# Patient Record
Sex: Female | Born: 1970 | Race: White | Hispanic: No | Smoking: Never smoker
Health system: Southern US, Community
[De-identification: ages and names within clinical notes are randomized; demographics above are authoritative.]

## PROBLEM LIST (undated history)

## (undated) DIAGNOSIS — C801 Malignant (primary) neoplasm, unspecified: Secondary | ICD-10-CM

## (undated) DIAGNOSIS — R0600 Dyspnea, unspecified: Secondary | ICD-10-CM

## (undated) DIAGNOSIS — S7290XA Unspecified fracture of unspecified femur, initial encounter for closed fracture: Secondary | ICD-10-CM

## (undated) DIAGNOSIS — F32A Depression, unspecified: Secondary | ICD-10-CM

## (undated) DIAGNOSIS — J45909 Unspecified asthma, uncomplicated: Secondary | ICD-10-CM

## (undated) DIAGNOSIS — M1990 Unspecified osteoarthritis, unspecified site: Secondary | ICD-10-CM

## (undated) DIAGNOSIS — I10 Essential (primary) hypertension: Secondary | ICD-10-CM

## (undated) DIAGNOSIS — G43909 Migraine, unspecified, not intractable, without status migrainosus: Secondary | ICD-10-CM

## (undated) DIAGNOSIS — I639 Cerebral infarction, unspecified: Secondary | ICD-10-CM

## (undated) DIAGNOSIS — F419 Anxiety disorder, unspecified: Secondary | ICD-10-CM

## (undated) DIAGNOSIS — E785 Hyperlipidemia, unspecified: Secondary | ICD-10-CM

## (undated) DIAGNOSIS — T4145XA Adverse effect of unspecified anesthetic, initial encounter: Secondary | ICD-10-CM

## (undated) DIAGNOSIS — G894 Chronic pain syndrome: Secondary | ICD-10-CM

## (undated) DIAGNOSIS — Z87442 Personal history of urinary calculi: Secondary | ICD-10-CM

## (undated) DIAGNOSIS — K219 Gastro-esophageal reflux disease without esophagitis: Secondary | ICD-10-CM

## (undated) DIAGNOSIS — T8859XA Other complications of anesthesia, initial encounter: Secondary | ICD-10-CM

## (undated) DIAGNOSIS — R002 Palpitations: Secondary | ICD-10-CM

## (undated) DIAGNOSIS — F329 Major depressive disorder, single episode, unspecified: Secondary | ICD-10-CM

## (undated) HISTORY — PX: LITHOTRIPSY: SUR834

## (undated) HISTORY — PX: EYE SURGERY: SHX253

---

## 2008-11-04 ENCOUNTER — Ambulatory Visit (HOSPITAL_COMMUNITY): Admission: RE | Admit: 2008-11-04 | Discharge: 2008-11-04 | Payer: Self-pay | Admitting: Urology

## 2008-11-08 ENCOUNTER — Ambulatory Visit (HOSPITAL_COMMUNITY): Admission: RE | Admit: 2008-11-08 | Discharge: 2008-11-08 | Payer: Self-pay | Admitting: Urology

## 2008-11-19 HISTORY — PX: SHOULDER SURGERY: SHX246

## 2009-09-06 ENCOUNTER — Ambulatory Visit (HOSPITAL_BASED_OUTPATIENT_CLINIC_OR_DEPARTMENT_OTHER): Admission: RE | Admit: 2009-09-06 | Discharge: 2009-09-06 | Payer: Self-pay | Admitting: Orthopaedic Surgery

## 2011-02-22 LAB — BASIC METABOLIC PANEL
BUN: 11 mg/dL (ref 6–23)
CO2: 30 mEq/L (ref 19–32)
Chloride: 105 mEq/L (ref 96–112)
Glucose, Bld: 113 mg/dL — ABNORMAL HIGH (ref 70–99)
Potassium: 4.5 mEq/L (ref 3.5–5.1)

## 2011-08-24 LAB — CBC
HCT: 42.9 % (ref 36.0–46.0)
Hemoglobin: 14.8 g/dL (ref 12.0–15.0)
MCHC: 34.4 g/dL (ref 30.0–36.0)
MCV: 89.8 fL (ref 78.0–100.0)
RDW: 12.4 % (ref 11.5–15.5)

## 2012-06-12 DIAGNOSIS — M79673 Pain in unspecified foot: Secondary | ICD-10-CM | POA: Insufficient documentation

## 2012-06-12 HISTORY — DX: Pain in unspecified foot: M79.673

## 2012-12-15 DIAGNOSIS — G894 Chronic pain syndrome: Secondary | ICD-10-CM | POA: Insufficient documentation

## 2012-12-15 HISTORY — DX: Chronic pain syndrome: G89.4

## 2013-03-13 DIAGNOSIS — M5416 Radiculopathy, lumbar region: Secondary | ICD-10-CM

## 2013-03-13 HISTORY — DX: Radiculopathy, lumbar region: M54.16

## 2015-08-08 DIAGNOSIS — J370 Chronic laryngitis: Secondary | ICD-10-CM

## 2015-08-08 DIAGNOSIS — J309 Allergic rhinitis, unspecified: Secondary | ICD-10-CM

## 2015-08-08 HISTORY — DX: Chronic laryngitis: J37.0

## 2015-08-08 HISTORY — DX: Allergic rhinitis, unspecified: J30.9

## 2015-08-25 ENCOUNTER — Ambulatory Visit: Payer: Self-pay | Admitting: Allergy and Immunology

## 2015-09-19 ENCOUNTER — Ambulatory Visit: Payer: Self-pay | Admitting: Allergy and Immunology

## 2016-09-26 ENCOUNTER — Other Ambulatory Visit: Payer: Self-pay

## 2017-03-14 DIAGNOSIS — F331 Major depressive disorder, recurrent, moderate: Secondary | ICD-10-CM | POA: Insufficient documentation

## 2017-03-14 DIAGNOSIS — F411 Generalized anxiety disorder: Secondary | ICD-10-CM

## 2017-03-14 HISTORY — DX: Major depressive disorder, recurrent, moderate: F33.1

## 2017-03-14 HISTORY — DX: Generalized anxiety disorder: F41.1

## 2018-01-13 ENCOUNTER — Other Ambulatory Visit: Payer: Self-pay | Admitting: Anesthesiology

## 2018-01-27 ENCOUNTER — Encounter (HOSPITAL_COMMUNITY)
Admission: RE | Admit: 2018-01-27 | Discharge: 2018-01-27 | Disposition: A | Discharge status: Home / Self Care | Payer: Medicaid Other | Source: Ambulatory Visit | Attending: Anesthesiology | Admitting: Anesthesiology

## 2018-01-27 ENCOUNTER — Other Ambulatory Visit: Payer: Self-pay

## 2018-01-27 ENCOUNTER — Encounter (HOSPITAL_COMMUNITY): Payer: Self-pay

## 2018-01-27 DIAGNOSIS — F4542 Pain disorder with related psychological factors: Secondary | ICD-10-CM | POA: Diagnosis not present

## 2018-01-27 DIAGNOSIS — I10 Essential (primary) hypertension: Secondary | ICD-10-CM | POA: Insufficient documentation

## 2018-01-27 DIAGNOSIS — F411 Generalized anxiety disorder: Secondary | ICD-10-CM | POA: Insufficient documentation

## 2018-01-27 DIAGNOSIS — Z01818 Encounter for other preprocedural examination: Secondary | ICD-10-CM | POA: Insufficient documentation

## 2018-01-27 DIAGNOSIS — G90522 Complex regional pain syndrome I of left lower limb: Secondary | ICD-10-CM | POA: Insufficient documentation

## 2018-01-27 DIAGNOSIS — F609 Personality disorder, unspecified: Secondary | ICD-10-CM | POA: Diagnosis not present

## 2018-01-27 DIAGNOSIS — R9431 Abnormal electrocardiogram [ECG] [EKG]: Secondary | ICD-10-CM | POA: Insufficient documentation

## 2018-01-27 DIAGNOSIS — F3342 Major depressive disorder, recurrent, in full remission: Secondary | ICD-10-CM | POA: Diagnosis not present

## 2018-01-27 HISTORY — DX: Cerebral infarction, unspecified: I63.9

## 2018-01-27 HISTORY — DX: Major depressive disorder, single episode, unspecified: F32.9

## 2018-01-27 HISTORY — DX: Chronic pain syndrome: G89.4

## 2018-01-27 HISTORY — DX: Essential (primary) hypertension: I10

## 2018-01-27 HISTORY — DX: Anxiety disorder, unspecified: F41.9

## 2018-01-27 HISTORY — DX: Hyperlipidemia, unspecified: E78.5

## 2018-01-27 HISTORY — DX: Migraine, unspecified, not intractable, without status migrainosus: G43.909

## 2018-01-27 HISTORY — DX: Depression, unspecified: F32.A

## 2018-01-27 HISTORY — DX: Adverse effect of unspecified anesthetic, initial encounter: T41.45XA

## 2018-01-27 HISTORY — DX: Dyspnea, unspecified: R06.00

## 2018-01-27 HISTORY — DX: Malignant (primary) neoplasm, unspecified: C80.1

## 2018-01-27 HISTORY — DX: Unspecified osteoarthritis, unspecified site: M19.90

## 2018-01-27 HISTORY — DX: Palpitations: R00.2

## 2018-01-27 HISTORY — DX: Unspecified asthma, uncomplicated: J45.909

## 2018-01-27 HISTORY — DX: Personal history of urinary calculi: Z87.442

## 2018-01-27 HISTORY — DX: Unspecified fracture of unspecified femur, initial encounter for closed fracture: S72.90XA

## 2018-01-27 HISTORY — DX: Other complications of anesthesia, initial encounter: T88.59XA

## 2018-01-27 HISTORY — DX: Gastro-esophageal reflux disease without esophagitis: K21.9

## 2018-01-27 LAB — BASIC METABOLIC PANEL
Anion gap: 10 (ref 5–15)
BUN: 7 mg/dL (ref 6–20)
CHLORIDE: 106 mmol/L (ref 101–111)
CO2: 22 mmol/L (ref 22–32)
CREATININE: 0.72 mg/dL (ref 0.44–1.00)
Calcium: 9.5 mg/dL (ref 8.9–10.3)
GFR calc Af Amer: >60 mL/min (ref >60)
GFR calc non Af Amer: >60 mL/min (ref >60)
GLUCOSE: 109 mg/dL — AB (ref 65–99)
POTASSIUM: 3.8 mmol/L (ref 3.5–5.1)
SODIUM: 138 mmol/L (ref 135–145)

## 2018-01-27 LAB — APTT: aPTT: 30 seconds (ref 24–36)

## 2018-01-27 LAB — CBC
HEMATOCRIT: 40.5 % (ref 36.0–46.0)
Hemoglobin: 13.7 g/dL (ref 12.0–15.0)
MCH: 29.3 pg (ref 26.0–34.0)
MCHC: 33.8 g/dL (ref 30.0–36.0)
MCV: 86.5 fL (ref 78.0–100.0)
PLATELETS: 223 10*3/uL (ref 150–400)
RBC: 4.68 MIL/uL (ref 3.87–5.11)
RDW: 12.9 % (ref 11.5–15.5)
WBC: 10 10*3/uL (ref 4.0–10.5)

## 2018-01-27 LAB — PROTIME-INR
INR: 1.02
Prothrombin Time: 13.3 seconds (ref 11.4–15.2)

## 2018-01-27 LAB — SURGICAL PCR SCREEN
MRSA, PCR: NEGATIVE
Staphylococcus aureus: NEGATIVE

## 2018-01-27 NOTE — Pre-Procedure Instructions (Signed)
Sandy Garcia  01/27/2018      [facility] - [place], [place] - [place] [address] Phone: [phone] Fax: [phone]    Your procedure is scheduled on January 31, 2018.  Report to [facility] Admitting at 07:30 A.M.  Call this number if you have problems the morning of surgery:  [phone]   Remember:  Do not eat food or drink liquids after midnight.  Continue all other medications as directed by your physician except for following these medication instructions before surgery.   Take these medicines the morning of surgery with A SIP OF WATER : Albuterol inhaler if needed--bring with you Bupropion (Wellbutrin XL) Cetirizine (Zyrtec) Gabapentin Enacarbil (Horizant) Omeprazole (Prilosec) Ranitidine (Zantac) Pain pill Hydrocodone-acetaminophen if needed  7 days prior to surgery STOP taking any Aspirin (unless otherwise instructed by your surgeon), Aleve, Naproxen, Ibuprofen, Motrin, Advil, Goody's, BC's, all herbal medications, fish oil, and all vitamins.   Do not wear jewelry, make-up or nail polish.  Do not wear lotions, powders, or perfumes, or deodorant.  Do not shave 48 hours prior to surgery.    Do not bring valuables to the hospital.  [facility] is not responsible for any belongings or valuables.  Contacts, dentures or bridgework may not be worn into surgery.  Leave your suitcase in the car.  After surgery it may be brought to your room.  For patients admitted to the hospital, discharge time will be determined by your treatment team.  Patients discharged the day of surgery will not be allowed to drive home.   Special instructions:   - Preparing For Surgery  Before surgery, you can play an important role. Because skin is not sterile, your skin needs to be as free of germs as possible. You can reduce the number of germs on your skin by washing with CHG (chlorahexidine gluconate) Soap before  surgery.  CHG is an antiseptic cleaner which kills germs and bonds with the skin to continue killing germs even after washing.  Please do not use if you have an allergy to CHG or antibacterial soaps. If your skin becomes reddened/irritated stop using the CHG.  Do not shave (including legs and underarms) for at least 48 hours prior to first CHG shower. It is OK to shave your face.  Please follow these instructions carefully.   1. Shower the NIGHT BEFORE SURGERY and the MORNING OF SURGERY with CHG.   2. If you chose to wash your hair, wash your hair first as usual with your normal shampoo.  3. After you shampoo, rinse your hair and body thoroughly to remove the shampoo.  4. Use CHG as you would any other liquid soap. You can apply CHG directly to the skin and wash gently with a scrungie or a clean washcloth.   5. Apply the CHG Soap to your body ONLY FROM THE NECK DOWN.  Do not use on open wounds or open sores. Avoid contact with your eyes, ears, mouth and genitals (private parts). Wash Face and genitals (private parts)  with your normal soap.  6. Wash thoroughly, paying special attention to the area where your surgery will be performed.  7. Thoroughly rinse your body with warm water from the neck down.  8. DO NOT shower/wash with your normal soap after using and rinsing off the CHG Soap.  9. Pat yourself dry with a CLEAN TOWEL.  10. Wear CLEAN PAJAMAS to bed the  night before surgery, wear comfortable clothes the morning of surgery  11. Place CLEAN SHEETS on your bed the night of your first shower and DO NOT SLEEP WITH PETS.    Day of Surgery: Do not apply any deodorants/lotions. Please wear clean clothes to the hospital/surgery center.      Please read over the following fact sheets that you were given.

## 2018-01-27 NOTE — Progress Notes (Signed)
PCP: Dr. Sarina Ser Blackwood Cardiologist: denies  EKG: Today CXR: denies ECHO: denies Stress Test: denies Cardiac Cath: denies  Patient denies shortness of breath, fever, cough, and chest pain at PAT appointment.  Patient verbalized understanding of instructions provided today at the PAT appointment.  Patient asked to review instructions at home and day of surgery.   Dr. Donell Sievert office called about Ancef allergy, left VM.

## 2018-01-28 ENCOUNTER — Encounter (HOSPITAL_COMMUNITY): Payer: Self-pay | Admitting: Vascular Surgery
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
## 2018-01-31 ENCOUNTER — Encounter (HOSPITAL_COMMUNITY): Admission: RE | Payer: Self-pay | Source: Ambulatory Visit

## 2018-01-31 ENCOUNTER — Ambulatory Visit (HOSPITAL_COMMUNITY): Admission: RE | Admit: 2018-01-31 | Payer: Medicaid Other | Source: Ambulatory Visit | Admitting: Anesthesiology

## 2018-01-31 SURGERY — INSERTION, SPINAL CORD STIMULATOR, LUMBAR
Anesthesia: Monitor Anesthesia Care

## 2018-02-03 ENCOUNTER — Other Ambulatory Visit: Payer: Self-pay | Admitting: Anesthesiology
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
## 2018-02-27 ENCOUNTER — Encounter (HOSPITAL_COMMUNITY): Payer: Self-pay | Admitting: *Deleted

## 2018-02-27 ENCOUNTER — Other Ambulatory Visit: Payer: Self-pay

## 2018-02-27 MED ORDER — VANCOMYCIN HCL 10 G IV SOLR
1500.0000 mg | INTRAVENOUS | Status: AC
  Administered 2018-02-28: 1500 mg via INTRAVENOUS
  Filled 2018-02-27: qty 1500

## 2018-02-27 NOTE — Progress Notes (Signed)
   02/27/18 1847  OBSTRUCTIVE SLEEP APNEA  Have you ever been diagnosed with sleep apnea through a sleep study? No  Do you snore loudly (loud enough to be heard through closed doors)?  1  Do you often feel tired, fatigued, or sleepy during the daytime (such as falling asleep during driving or talking to someone)? 0  Has anyone observed you stop breathing during your sleep? 1  Do you have, or are you being treated for high blood pressure? 1  BMI more than 35 kg/m2? 1  Age > 50 (1-yes) 0  Neck circumference greater than:Female 16 inches or larger, Female 17inches or larger? 1 (16.5)  Female Gender (Yes=1) 0  Obstructive Sleep Apnea Score 5  Score 5 or greater  Results sent to PCP

## 2018-02-28 ENCOUNTER — Observation Stay (HOSPITAL_COMMUNITY)
Admission: RE | Admit: 2018-02-28 | Discharge: 2018-03-01 | Disposition: A | Discharge status: Home / Self Care | Payer: Medicaid Other | Source: Ambulatory Visit | Attending: Anesthesiology | Admitting: Anesthesiology

## 2018-02-28 ENCOUNTER — Encounter (HOSPITAL_COMMUNITY)
Admission: RE | Disposition: A | Discharge status: Home / Self Care | Payer: Self-pay | Source: Ambulatory Visit | Attending: Anesthesiology

## 2018-02-28 ENCOUNTER — Ambulatory Visit (HOSPITAL_COMMUNITY): Payer: Medicaid Other | Admitting: Certified Registered"

## 2018-02-28 ENCOUNTER — Inpatient Hospital Stay (HOSPITAL_COMMUNITY): Payer: Medicaid Other

## 2018-02-28 ENCOUNTER — Encounter (HOSPITAL_COMMUNITY): Payer: Self-pay | Admitting: Surgery

## 2018-02-28 ENCOUNTER — Ambulatory Visit (HOSPITAL_COMMUNITY): Payer: Medicaid Other

## 2018-02-28 DIAGNOSIS — Z6841 Body Mass Index (BMI) 40.0 and over, adult: Secondary | ICD-10-CM | POA: Diagnosis not present

## 2018-02-28 DIAGNOSIS — Z9689 Presence of other specified functional implants: Secondary | ICD-10-CM

## 2018-02-28 DIAGNOSIS — I10 Essential (primary) hypertension: Secondary | ICD-10-CM | POA: Diagnosis not present

## 2018-02-28 DIAGNOSIS — G894 Chronic pain syndrome: Principal | ICD-10-CM | POA: Insufficient documentation

## 2018-02-28 DIAGNOSIS — Z8673 Personal history of transient ischemic attack (TIA), and cerebral infarction without residual deficits: Secondary | ICD-10-CM | POA: Insufficient documentation

## 2018-02-28 DIAGNOSIS — Z881 Allergy status to other antibiotic agents status: Secondary | ICD-10-CM | POA: Insufficient documentation

## 2018-02-28 DIAGNOSIS — J45909 Unspecified asthma, uncomplicated: Secondary | ICD-10-CM | POA: Diagnosis not present

## 2018-02-28 DIAGNOSIS — G8929 Other chronic pain: Secondary | ICD-10-CM | POA: Diagnosis not present

## 2018-02-28 DIAGNOSIS — E785 Hyperlipidemia, unspecified: Secondary | ICD-10-CM | POA: Insufficient documentation

## 2018-02-28 DIAGNOSIS — Z885 Allergy status to narcotic agent status: Secondary | ICD-10-CM | POA: Insufficient documentation

## 2018-02-28 DIAGNOSIS — Z79899 Other long term (current) drug therapy: Secondary | ICD-10-CM | POA: Insufficient documentation

## 2018-02-28 DIAGNOSIS — Z882 Allergy status to sulfonamides status: Secondary | ICD-10-CM | POA: Insufficient documentation

## 2018-02-28 DIAGNOSIS — G90522 Complex regional pain syndrome I of left lower limb: Secondary | ICD-10-CM | POA: Diagnosis not present

## 2018-02-28 DIAGNOSIS — Z9889 Other specified postprocedural states: Secondary | ICD-10-CM | POA: Diagnosis present

## 2018-02-28 DIAGNOSIS — K219 Gastro-esophageal reflux disease without esophagitis: Secondary | ICD-10-CM | POA: Diagnosis not present

## 2018-02-28 DIAGNOSIS — Z91040 Latex allergy status: Secondary | ICD-10-CM | POA: Diagnosis not present

## 2018-02-28 DIAGNOSIS — Z4889 Encounter for other specified surgical aftercare: Secondary | ICD-10-CM

## 2018-02-28 DIAGNOSIS — R0902 Hypoxemia: Secondary | ICD-10-CM

## 2018-02-28 DIAGNOSIS — Z419 Encounter for procedure for purposes other than remedying health state, unspecified: Secondary | ICD-10-CM

## 2018-02-28 HISTORY — DX: Presence of other specified functional implants: Z96.89

## 2018-02-28 HISTORY — DX: Hypoxemia: R09.02

## 2018-02-28 HISTORY — PX: SPINAL CORD STIMULATOR INSERTION: SHX5378

## 2018-02-28 LAB — BASIC METABOLIC PANEL
Anion gap: 11 (ref 5–15)
BUN: 8 mg/dL (ref 6–20)
CO2: 22 mmol/L (ref 22–32)
CREATININE: 0.73 mg/dL (ref 0.44–1.00)
Calcium: 8.9 mg/dL (ref 8.9–10.3)
Chloride: 105 mmol/L (ref 101–111)
GFR calc non Af Amer: >60 mL/min (ref >60)
Glucose, Bld: 108 mg/dL — ABNORMAL HIGH (ref 65–99)
Potassium: 3.4 mmol/L — ABNORMAL LOW (ref 3.5–5.1)
SODIUM: 138 mmol/L (ref 135–145)

## 2018-02-28 LAB — CBC
HCT: 37.8 % (ref 36.0–46.0)
Hemoglobin: 12.8 g/dL (ref 12.0–15.0)
MCH: 29.5 pg (ref 26.0–34.0)
MCHC: 33.9 g/dL (ref 30.0–36.0)
MCV: 87.1 fL (ref 78.0–100.0)
PLATELETS: 221 10*3/uL (ref 150–400)
RBC: 4.34 MIL/uL (ref 3.87–5.11)
RDW: 12.9 % (ref 11.5–15.5)
WBC: 8.6 10*3/uL (ref 4.0–10.5)

## 2018-02-28 LAB — POCT PREGNANCY, URINE: Preg Test, Ur: NEGATIVE

## 2018-02-28 SURGERY — INSERTION, SPINAL CORD STIMULATOR, LUMBAR
Anesthesia: General | Site: Spine Lumbar

## 2018-02-28 MED ORDER — ALUM & MAG HYDROXIDE-SIMETH 200-200-20 MG/5ML PO SUSP
30.0000 mL | ORAL | Status: DC | PRN
  Administered 2018-02-28: 30 mL via ORAL
  Filled 2018-02-28: qty 30

## 2018-02-28 MED ORDER — ALBUTEROL SULFATE (2.5 MG/3ML) 0.083% IN NEBU
2.5000 mg | INHALATION_SOLUTION | Freq: Four times a day (QID) | RESPIRATORY_TRACT | Status: DC | PRN
Start: 2018-02-28 — End: 2018-03-01
  Filled 2018-02-28: qty 3

## 2018-02-28 MED ORDER — FENTANYL CITRATE (PF) 100 MCG/2ML IJ SOLN
INTRAMUSCULAR | Status: DC | PRN
  Administered 2018-02-28: 50 ug via INTRAVENOUS
  Administered 2018-02-28: 200 ug via INTRAVENOUS
  Administered 2018-02-28 (×3): 50 ug via INTRAVENOUS

## 2018-02-28 MED ORDER — LIDOCAINE 2% (20 MG/ML) 5 ML SYRINGE
INTRAMUSCULAR | Status: DC | PRN
  Administered 2018-02-28: 100 mg via INTRAVENOUS

## 2018-02-28 MED ORDER — HYDROMORPHONE HCL 1 MG/ML IJ SOLN
INTRAMUSCULAR | Status: AC
  Filled 2018-02-28: qty 1

## 2018-02-28 MED ORDER — DEXAMETHASONE SODIUM PHOSPHATE 10 MG/ML IJ SOLN
INTRAMUSCULAR | Status: AC
  Filled 2018-02-28: qty 1

## 2018-02-28 MED ORDER — LIDOCAINE 2% (20 MG/ML) 5 ML SYRINGE
INTRAMUSCULAR | Status: AC
  Filled 2018-02-28: qty 5

## 2018-02-28 MED ORDER — LACTATED RINGERS IV SOLN
INTRAVENOUS | Status: DC
  Administered 2018-02-28: 09:00:00 via INTRAVENOUS

## 2018-02-28 MED ORDER — 0.9 % SODIUM CHLORIDE (POUR BTL) OPTIME
TOPICAL | Status: DC | PRN
  Administered 2018-02-28: 1000 mL

## 2018-02-28 MED ORDER — OXYCODONE HCL 5 MG PO TABS
10.0000 mg | ORAL_TABLET | ORAL | Status: DC | PRN
  Administered 2018-02-28 – 2018-03-01 (×5): 10 mg via ORAL
  Filled 2018-02-28 (×5): qty 2

## 2018-02-28 MED ORDER — AMLODIPINE BESY-BENAZEPRIL HCL 5-20 MG PO CAPS: 1.0000 | ORAL_CAPSULE | Freq: Every day | ORAL | Status: DC

## 2018-02-28 MED ORDER — PROPOFOL 10 MG/ML IV BOLUS
INTRAVENOUS | Status: DC | PRN
  Administered 2018-02-28: 200 mg via INTRAVENOUS
  Administered 2018-02-28: 50 mg via INTRAVENOUS

## 2018-02-28 MED ORDER — ROCURONIUM BROMIDE 10 MG/ML (PF) SYRINGE
PREFILLED_SYRINGE | INTRAVENOUS | Status: AC
  Filled 2018-02-28: qty 5

## 2018-02-28 MED ORDER — KETAMINE HCL 10 MG/ML IJ SOLN
INTRAMUSCULAR | Status: DC | PRN
  Administered 2018-02-28: 60 mg via INTRAVENOUS

## 2018-02-28 MED ORDER — SODIUM CHLORIDE 0.9% FLUSH: 3.0000 mL | INTRAVENOUS | Status: DC | PRN

## 2018-02-28 MED ORDER — ONDANSETRON HCL 4 MG/2ML IJ SOLN
INTRAMUSCULAR | Status: AC
  Filled 2018-02-28: qty 2

## 2018-02-28 MED ORDER — OXYCODONE HCL 5 MG PO TABS
5.0000 mg | ORAL_TABLET | Freq: Once | ORAL | Status: AC | PRN
  Administered 2018-02-28: 5 mg via ORAL

## 2018-02-28 MED ORDER — CHLORHEXIDINE GLUCONATE CLOTH 2 % EX PADS: 6.0000 | MEDICATED_PAD | Freq: Once | CUTANEOUS | Status: DC

## 2018-02-28 MED ORDER — AMLODIPINE BESYLATE 5 MG PO TABS
5.0000 mg | ORAL_TABLET | Freq: Every day | ORAL | Status: DC
  Filled 2018-02-28: qty 1

## 2018-02-28 MED ORDER — ARTIFICIAL TEARS OPHTHALMIC OINT
TOPICAL_OINTMENT | OPHTHALMIC | Status: DC | PRN
  Administered 2018-02-28: 1 via OPHTHALMIC

## 2018-02-28 MED ORDER — HYDROCODONE-ACETAMINOPHEN 5-325 MG PO TABS: 1.0000 | ORAL_TABLET | ORAL | Status: DC | PRN

## 2018-02-28 MED ORDER — GABAPENTIN ENACARBIL ER 600 MG PO TBCR
600.0000 mg | EXTENDED_RELEASE_TABLET | Freq: Two times a day (BID) | ORAL | Status: DC
  Administered 2018-02-28: 600 mg via ORAL

## 2018-02-28 MED ORDER — MIDAZOLAM HCL 2 MG/2ML IJ SOLN
INTRAMUSCULAR | Status: AC
  Filled 2018-02-28: qty 2

## 2018-02-28 MED ORDER — DEXAMETHASONE SODIUM PHOSPHATE 4 MG/ML IJ SOLN
INTRAMUSCULAR | Status: DC | PRN
  Administered 2018-02-28: 10 mg via INTRAVENOUS

## 2018-02-28 MED ORDER — ONDANSETRON HCL 4 MG PO TABS: 4.0000 mg | ORAL_TABLET | Freq: Four times a day (QID) | ORAL | Status: DC | PRN

## 2018-02-28 MED ORDER — FENTANYL CITRATE (PF) 250 MCG/5ML IJ SOLN
INTRAMUSCULAR | Status: AC
  Filled 2018-02-28: qty 5

## 2018-02-28 MED ORDER — HYDROCODONE-ACETAMINOPHEN 5-325 MG PO TABS: 1.0000 | ORAL_TABLET | ORAL | 0 refills | Status: AC | PRN

## 2018-02-28 MED ORDER — GABAPENTIN 300 MG PO CAPS: 600.0000 mg | ORAL_CAPSULE | Freq: Two times a day (BID) | ORAL | Status: DC

## 2018-02-28 MED ORDER — ONDANSETRON HCL 4 MG/2ML IJ SOLN
INTRAMUSCULAR | Status: DC | PRN
  Administered 2018-02-28: 4 mg via INTRAVENOUS

## 2018-02-28 MED ORDER — BUPROPION HCL ER (XL) 300 MG PO TB24
300.0000 mg | ORAL_TABLET | Freq: Every day | ORAL | Status: DC
  Filled 2018-02-28: qty 1

## 2018-02-28 MED ORDER — SODIUM CHLORIDE 0.9% FLUSH
3.0000 mL | Freq: Two times a day (BID) | INTRAVENOUS | Status: DC
  Administered 2018-02-28: 3 mL via INTRAVENOUS

## 2018-02-28 MED ORDER — MENTHOL 3 MG MT LOZG
1.0000 | LOZENGE | OROMUCOSAL | Status: DC | PRN
  Filled 2018-02-28: qty 9

## 2018-02-28 MED ORDER — ALBUTEROL SULFATE HFA 108 (90 BASE) MCG/ACT IN AERS: 2.0000 | INHALATION_SPRAY | Freq: Four times a day (QID) | RESPIRATORY_TRACT | Status: DC | PRN

## 2018-02-28 MED ORDER — KETAMINE HCL 10 MG/ML IJ SOLN
INTRAMUSCULAR | Status: AC
  Filled 2018-02-28: qty 1

## 2018-02-28 MED ORDER — BACITRACIN 50000 UNITS IM SOLR
INTRAMUSCULAR | Status: DC | PRN
  Administered 2018-02-28: 500 mL

## 2018-02-28 MED ORDER — HYDROMORPHONE HCL 1 MG/ML IJ SOLN
0.2500 mg | INTRAMUSCULAR | Status: DC | PRN
  Administered 2018-02-28 (×4): 0.5 mg via INTRAVENOUS

## 2018-02-28 MED ORDER — ONDANSETRON HCL 4 MG/2ML IJ SOLN
4.0000 mg | Freq: Once | INTRAMUSCULAR | Status: AC
  Administered 2018-02-28: 4 mg via INTRAVENOUS

## 2018-02-28 MED ORDER — FAMOTIDINE 20 MG PO TABS
10.0000 mg | ORAL_TABLET | Freq: Two times a day (BID) | ORAL | Status: DC
  Administered 2018-02-28: 10 mg via ORAL
  Filled 2018-02-28: qty 1

## 2018-02-28 MED ORDER — PANTOPRAZOLE SODIUM 40 MG PO TBEC: 40.0000 mg | DELAYED_RELEASE_TABLET | Freq: Every day | ORAL | Status: DC

## 2018-02-28 MED ORDER — OXYCODONE HCL 5 MG PO TABS
ORAL_TABLET | ORAL | Status: AC
  Filled 2018-02-28: qty 1

## 2018-02-28 MED ORDER — FENTANYL CITRATE (PF) 250 MCG/5ML IJ SOLN
INTRAMUSCULAR | Status: AC
Start: 2018-02-28 — End: 2018-02-28
  Filled 2018-02-28: qty 5

## 2018-02-28 MED ORDER — ACETAMINOPHEN 650 MG RE SUPP: 650.0000 mg | RECTAL | Status: DC | PRN

## 2018-02-28 MED ORDER — MIDAZOLAM HCL 5 MG/5ML IJ SOLN
INTRAMUSCULAR | Status: DC | PRN
  Administered 2018-02-28: 2 mg via INTRAVENOUS

## 2018-02-28 MED ORDER — FLUTICASONE PROPIONATE 50 MCG/ACT NA SUSP
1.0000 | Freq: Two times a day (BID) | NASAL | Status: DC | PRN
  Filled 2018-02-28: qty 16

## 2018-02-28 MED ORDER — PROPOFOL 10 MG/ML IV BOLUS
INTRAVENOUS | Status: AC
  Filled 2018-02-28: qty 20

## 2018-02-28 MED ORDER — CYCLOBENZAPRINE HCL 10 MG PO TABS
10.0000 mg | ORAL_TABLET | Freq: Two times a day (BID) | ORAL | Status: DC | PRN
  Filled 2018-02-28: qty 1

## 2018-02-28 MED ORDER — HYDROCHLOROTHIAZIDE 12.5 MG PO CAPS
12.5000 mg | ORAL_CAPSULE | Freq: Every day | ORAL | Status: DC
  Filled 2018-02-28: qty 1

## 2018-02-28 MED ORDER — BISOPROLOL-HYDROCHLOROTHIAZIDE 10-6.25 MG PO TABS
1.0000 | ORAL_TABLET | Freq: Every day | ORAL | Status: DC
  Filled 2018-02-28 (×2): qty 1

## 2018-02-28 MED ORDER — ARTIFICIAL TEARS OPHTHALMIC OINT
TOPICAL_OINTMENT | OPHTHALMIC | Status: AC
  Filled 2018-02-28: qty 3.5

## 2018-02-28 MED ORDER — ROCURONIUM BROMIDE 100 MG/10ML IV SOLN
INTRAVENOUS | Status: DC | PRN
  Administered 2018-02-28: 20 mg via INTRAVENOUS
  Administered 2018-02-28: 30 mg via INTRAVENOUS

## 2018-02-28 MED ORDER — BACITRACIN-NEOMYCIN-POLYMYXIN OINTMENT TUBE: TOPICAL_OINTMENT | CUTANEOUS | Status: DC | PRN

## 2018-02-28 MED ORDER — PHENYLEPHRINE 40 MCG/ML (10ML) SYRINGE FOR IV PUSH (FOR BLOOD PRESSURE SUPPORT)
PREFILLED_SYRINGE | INTRAVENOUS | Status: AC
  Filled 2018-02-28: qty 10

## 2018-02-28 MED ORDER — BUPIVACAINE-EPINEPHRINE (PF) 0.5% -1:200000 IJ SOLN
INTRAMUSCULAR | Status: AC
  Filled 2018-02-28: qty 60

## 2018-02-28 MED ORDER — CLINDAMYCIN HCL 150 MG PO CAPS: 150.0000 mg | ORAL_CAPSULE | Freq: Three times a day (TID) | ORAL | 0 refills | Status: AC

## 2018-02-28 MED ORDER — HYDROMORPHONE HCL 1 MG/ML IJ SOLN: 0.5000 mg | INTRAMUSCULAR | Status: DC | PRN

## 2018-02-28 MED ORDER — OXYCODONE HCL 5 MG/5ML PO SOLN: 5.0000 mg | Freq: Once | ORAL | Status: AC | PRN

## 2018-02-28 MED ORDER — LORATADINE 10 MG PO TABS
10.0000 mg | ORAL_TABLET | Freq: Every day | ORAL | Status: DC
  Administered 2018-02-28: 10 mg via ORAL
  Filled 2018-02-28: qty 1

## 2018-02-28 MED ORDER — BUPIVACAINE-EPINEPHRINE (PF) 0.5% -1:200000 IJ SOLN
INTRAMUSCULAR | Status: DC | PRN
  Administered 2018-02-28: 24 mL

## 2018-02-28 MED ORDER — ACETAMINOPHEN 325 MG PO TABS: 650.0000 mg | ORAL_TABLET | ORAL | Status: DC | PRN

## 2018-02-28 MED ORDER — ACETAMINOPHEN 500 MG PO TABS
1000.0000 mg | ORAL_TABLET | Freq: Four times a day (QID) | ORAL | Status: DC
  Administered 2018-02-28 – 2018-03-01 (×3): 1000 mg via ORAL
  Filled 2018-02-28 (×3): qty 2

## 2018-02-28 MED ORDER — VANCOMYCIN HCL 10 G IV SOLR
1500.0000 mg | Freq: Once | INTRAVENOUS | Status: AC
  Administered 2018-02-28: 1500 mg via INTRAVENOUS
  Filled 2018-02-28 (×2): qty 1500

## 2018-02-28 MED ORDER — PHENYLEPHRINE 40 MCG/ML (10ML) SYRINGE FOR IV PUSH (FOR BLOOD PRESSURE SUPPORT)
PREFILLED_SYRINGE | INTRAVENOUS | Status: DC | PRN
  Administered 2018-02-28: 80 ug via INTRAVENOUS

## 2018-02-28 MED ORDER — LACTATED RINGERS IV SOLN
INTRAVENOUS | Status: DC | PRN
  Administered 2018-02-28: 09:00:00 via INTRAVENOUS

## 2018-02-28 MED ORDER — GLYCOPYRROLATE 0.2 MG/ML IJ SOLN
INTRAMUSCULAR | Status: DC | PRN
  Administered 2018-02-28: 0.1 mg via INTRAVENOUS

## 2018-02-28 MED ORDER — BENAZEPRIL HCL 20 MG PO TABS
20.0000 mg | ORAL_TABLET | Freq: Every day | ORAL | Status: DC
  Filled 2018-02-28 (×2): qty 1

## 2018-02-28 MED ORDER — BACITRACIN-NEOMYCIN-POLYMYXIN 400-5-5000 EX OINT
TOPICAL_OINTMENT | CUTANEOUS | Status: AC
  Filled 2018-02-28: qty 1

## 2018-02-28 MED ORDER — PHENOL 1.4 % MT LIQD
1.0000 | OROMUCOSAL | Status: DC | PRN
  Filled 2018-02-28: qty 177

## 2018-02-28 MED ORDER — EPHEDRINE 5 MG/ML INJ
INTRAVENOUS | Status: AC
  Filled 2018-02-28: qty 10

## 2018-02-28 MED ORDER — ONDANSETRON HCL 4 MG/2ML IJ SOLN: 4.0000 mg | Freq: Four times a day (QID) | INTRAMUSCULAR | Status: DC | PRN

## 2018-02-28 MED ORDER — SUGAMMADEX SODIUM 500 MG/5ML IV SOLN
INTRAVENOUS | Status: AC
  Filled 2018-02-28: qty 5

## 2018-02-28 MED ORDER — SUGAMMADEX SODIUM 200 MG/2ML IV SOLN
INTRAVENOUS | Status: DC | PRN
  Administered 2018-02-28: 500 mg via INTRAVENOUS

## 2018-02-28 SURGICAL SUPPLY — 66 items
ANCHOR CLIK X NEURO (Stimulator) ×2 IMPLANT
BAG DECANTER FOR FLEXI CONT (MISCELLANEOUS) ×2 IMPLANT
BENZOIN TINCTURE PRP APPL 2/3 (GAUZE/BANDAGES/DRESSINGS) IMPLANT
BINDER ABDOMINAL 12 ML 46-62 (SOFTGOODS) ×2 IMPLANT
BLADE CLIPPER SURG (BLADE) IMPLANT
CHLORAPREP W/TINT 26ML (MISCELLANEOUS) ×2 IMPLANT
CLIP VESOCCLUDE SM WIDE 6/CT (CLIP) IMPLANT
DERMABOND ADVANCED (GAUZE/BANDAGES/DRESSINGS) ×1
DERMABOND ADVANCED .7 DNX12 (GAUZE/BANDAGES/DRESSINGS) ×1 IMPLANT
DRAPE C-ARM 42X72 X-RAY (DRAPES) ×2 IMPLANT
DRAPE C-ARMOR (DRAPES) ×2 IMPLANT
DRAPE LAPAROTOMY 100X72X124 (DRAPES) ×2 IMPLANT
DRAPE POUCH INSTRU U-SHP 10X18 (DRAPES) ×2 IMPLANT
DRAPE SURG 17X23 STRL (DRAPES) ×2 IMPLANT
DRSG OPSITE POSTOP 3X4 (GAUZE/BANDAGES/DRESSINGS) ×2 IMPLANT
DRSG OPSITE POSTOP 4X6 (GAUZE/BANDAGES/DRESSINGS) ×2 IMPLANT
ELECT REM PT RETURN 9FT ADLT (ELECTROSURGICAL) ×2
ELECTRODE REM PT RTRN 9FT ADLT (ELECTROSURGICAL) ×1 IMPLANT
GAUZE SPONGE 4X4 16PLY XRAY LF (GAUZE/BANDAGES/DRESSINGS) IMPLANT
GENERATOR NEUROSTIMULATOR (Neurostimulator) ×2 IMPLANT
GLOVE BIOGEL PI IND STRL 6.5 (GLOVE) ×3 IMPLANT
GLOVE BIOGEL PI IND STRL 7.5 (GLOVE) ×1 IMPLANT
GLOVE BIOGEL PI INDICATOR 6.5 (GLOVE) ×3
GLOVE BIOGEL PI INDICATOR 7.5 (GLOVE) ×1
GLOVE ECLIPSE 7.5 STRL STRAW (GLOVE) IMPLANT
GLOVE EXAM NITRILE LRG STRL (GLOVE) IMPLANT
GLOVE EXAM NITRILE XL STR (GLOVE) IMPLANT
GLOVE EXAM NITRILE XS STR PU (GLOVE) IMPLANT
GLOVE SURG SS PI 6.5 STRL IVOR (GLOVE) ×12 IMPLANT
GLOVE SURG SS PI 7.5 STRL IVOR (GLOVE) ×4 IMPLANT
GOWN STRL REUS W/ TWL LRG LVL3 (GOWN DISPOSABLE) IMPLANT
GOWN STRL REUS W/ TWL XL LVL3 (GOWN DISPOSABLE) IMPLANT
GOWN STRL REUS W/TWL 2XL LVL3 (GOWN DISPOSABLE) IMPLANT
GOWN STRL REUS W/TWL LRG LVL3 (GOWN DISPOSABLE)
GOWN STRL REUS W/TWL XL LVL3 (GOWN DISPOSABLE)
KIT BASIN OR (CUSTOM PROCEDURE TRAY) ×2 IMPLANT
KIT CHARGING (KITS) ×1
KIT CHARGING PRECISION NEURO (KITS) ×1 IMPLANT
KIT REMOTE CONTROL 112802 FREE (KITS) ×2 IMPLANT
KIT TURNOVER KIT B (KITS) ×2 IMPLANT
LEAD INFINION CX PERC 70CM (Lead) ×4 IMPLANT
NEEDLE 18GX1X1/2 (RX/OR ONLY) (NEEDLE) IMPLANT
NEEDLE ENTRADA 4.5IN (NEEDLE) ×4 IMPLANT
NEEDLE HYPO 25X1 1.5 SAFETY (NEEDLE) ×2 IMPLANT
NS IRRIG 1000ML POUR BTL (IV SOLUTION) ×2 IMPLANT
PACK LAMINECTOMY NEURO (CUSTOM PROCEDURE TRAY) ×2 IMPLANT
PAD ARMBOARD 7.5X6 YLW CONV (MISCELLANEOUS) ×2 IMPLANT
SPONGE LAP 4X18 X RAY DECT (DISPOSABLE) IMPLANT
SPONGE SURGIFOAM ABS GEL SZ50 (HEMOSTASIS) IMPLANT
STAPLER SKIN PROX WIDE 3.9 (STAPLE) ×2 IMPLANT
STRIP CLOSURE SKIN 1/2X4 (GAUZE/BANDAGES/DRESSINGS) IMPLANT
SUT MNCRL AB 4-0 PS2 18 (SUTURE) IMPLANT
SUT MON AB 3-0 SH 27 (SUTURE) ×2
SUT MON AB 3-0 SH27 (SUTURE) ×2 IMPLANT
SUT SILK 0 (SUTURE) ×1
SUT SILK 0 MO-6 18XCR BRD 8 (SUTURE) ×1 IMPLANT
SUT SILK 0 TIES 10X30 (SUTURE) IMPLANT
SUT SILK 2 0 TIES 10X30 (SUTURE) IMPLANT
SUT VIC AB 2-0 CP2 18 (SUTURE) ×6 IMPLANT
SYR 10ML LL (SYRINGE) IMPLANT
SYR EPIDURAL 5ML GLASS (SYRINGE) ×2 IMPLANT
TOOL LONG TUNNEL (SPINAL CORD STIMULATOR) ×2 IMPLANT
TOWEL GREEN STERILE (TOWEL DISPOSABLE) ×2 IMPLANT
TOWEL GREEN STERILE FF (TOWEL DISPOSABLE) ×2 IMPLANT
WATER STERILE IRR 1000ML POUR (IV SOLUTION) ×2 IMPLANT
YANKAUER SUCT BULB TIP NO VENT (SUCTIONS) ×2 IMPLANT

## 2018-02-28 NOTE — Discharge Instructions (Addendum)

## 2018-02-28 NOTE — Progress Notes (Signed)
Patient's O2 sat keeps dropping into the 70s and 80s when not on oxygen and she states she feels "really bad" and like she cannot breathe well. Tried incentive spirometer, sats better but still dropping into the mid 80s. Placed patient back on oxygen. Spoke with Dr. Maryjean Ka who ordered chest x-ray and wants the patient to stay the night for monitoring. Will await floor orders to be placed.

## 2018-02-28 NOTE — Anesthesia Procedure Notes (Signed)
Procedure Name: Intubation Date/Time: 02/28/2018 10:14 AM Performed by: Orlie Dakin, CRNA Pre-anesthesia Checklist: Patient identified, Emergency Drugs available, Suction available, Patient being monitored and Timeout performed Patient Re-evaluated:Patient Re-evaluated prior to induction Oxygen Delivery Method: Circle system utilized Preoxygenation: Pre-oxygenation with 100% oxygen Induction Type: IV induction Ventilation: Mask ventilation without difficulty Laryngoscope Size: Miller and 3 Grade View: Grade I Tube type: Oral Tube size: 7.0 mm Number of attempts: 1 Airway Equipment and Method: Stylet Placement Confirmation: ETT inserted through vocal cords under direct vision,  positive ETCO2 and breath sounds checked- equal and bilateral Secured at: 24 cm Tube secured with: Tape Dental Injury: Teeth and Oropharynx as per pre-operative assessment  Comments: 4x4s bite block used.

## 2018-02-28 NOTE — Op Note (Signed)
PREOP DX: 1) chronic pain syndrome 2)CRPS, lower extremity POSTOP NT:ZGYF as preop PROCEDURES PERFORMED:1) intraop fluoro 2) placement of 2 16 contact [employer] Infinion leads 3) placement of Spectra SCS generator 4) post op complex SCS programming SURGEON:Alexis Mizuno  ASSISTANT: NONE  ANESTHESIA:GETA EBL: <20cc  DESCRIPTION OF PROCEDURE: After a discussion of risks, benefits and alternatives, informed consent was obtained. The patient was taken to the OR,general anesthesia induced,turned prone onto a Jackson table, all pressure points padded, SCD's placed. A timeout was taken to verify the correct patient, position, personnel, availability of appropriate equipment, and administration of perioperative antibiotics.  The thoracic and lumbar areas were widely prepped with chloraprep and draped into a sterile field. Fluoroscopy was used to plan a RIGHTparamedian incision at the L1-L3 levels, and an incision made with a 10 blade and carried down to the dorsolumbar fascia with the bovie and blunt dissection. Retractors were placed and a 14g [employer] tuohy needle placed into the epidural space at the L1-2interspace using biplanar fluoro and loss-of-resistance technique. The needle was aspirated without any return of fluid. A [employer] INFINION lead was introduced and under live AP fluoro advanced until the distal-most2contactsoverlay thesuperioraspectof theT10vertebral body shadow with the rest of the contacts distributed over theT10and T11vertebral bodies in a position just right of anatomic midline. A second Infinion lead was placed just left of anatomic midline staggered so that the distal contacts were near the superior border of the T11 vertebral body, with the contacts distributed over T11, T12 and superior L1 using the same technique. 0 silk sutures were placed in the fascia adjacent to the needles. The needles and stylets were removed under fluoroscopy with no  lead migration noted. Leads were then fixed to the fascia byClik anchorswith the sutures; repeat images were obtained to verify that there had been no lead migration. The incision was inspected and hemostasis obtained with the bipolar cautery.    Attention was then turned to creation of a subcutaneous pocket. At therightflank, a 3 cm incision was made with a 10 blade and using the bovie and blunt dissection a pocket of size appropriate to place a SCS generator. The pocket was trialed, and found to be of adequate size. The pocket was inspected for hemostasis, which was found to be excellent. Using reverse seldinger technique, the leads were tunneled to the pocket site, and the leads inserted into the SCS generator. Impedances were checked, and all found to be excellent. The leads were then all fixed into position with a self-torquing wrench. The wiring was all carefully coiled, placed behind the generator and placed in the pocket.  Both incisions were copiously irrigated with bacitracin-containing irrigation. The lumbar incision was closed in 3 deep layers of interrupted 2-0 vicryl and the skin closed a running 3-0 monocryl subcuticular suture and dermabond.  The pocket incision was closed with a deeper layer of 2-0 vicryl interrupted sutures, and the skin closed a running 3-0 monocryl subcuticular suture and dermabond.  Sterile dressings were applied.   Needle, sponge, and instrument counts were correct x2 at the end of the case.    The patient was then carefully awakened from anesthesia, turned supine, an abdominal binder placed, and the patient taken to the recovery room where he underwent complex spinal cord stimulator programming.  COMPLICATIONS: NONE  CONDITION: Stable throughout the course of the procedure and immediately afterward  DISPOSITION: anticipate discharge to home, with antibiotics and pain medicine. Discussed care with the patientandfamily. Followup in clinic will be  scheduled in 10-14 days.

## 2018-02-28 NOTE — Transfer of Care (Signed)
Immediate Anesthesia Transfer of Care Note  Patient: Sandy Garcia  Procedure(s) Performed: LUMBAR SPINAL CORD STIMULATOR INSERTION (N/A Spine Lumbar)  Patient Location: PACU  Anesthesia Type:General  Level of Consciousness: awake, oriented and patient cooperative  Airway & Oxygen Therapy: Patient Spontanous Breathing and Patient connected to face mask oxygen  Post-op Assessment: Report given to RN and Post -op Vital signs reviewed and stable  Post vital signs: Reviewed and stable  Last Vitals:  Vitals Value Taken Time  BP 121/63 02/28/2018 11:55 AM  Temp    Pulse 95 02/28/2018 11:59 AM  Resp 17 02/28/2018 11:58 AM  SpO2 98 % 02/28/2018 11:59 AM  Vitals shown include unvalidated device data.  Last Pain:  Vitals:   02/28/18 0846  TempSrc:   PainSc: 0-No pain      Patients Stated Pain Goal: 3 (49/17/91 5056)  Complications: No apparent anesthesia complications

## 2018-02-28 NOTE — Anesthesia Postprocedure Evaluation (Signed)
Anesthesia Post Note  Patient: CATALIA MASSETT  Procedure(s) Performed: LUMBAR SPINAL CORD STIMULATOR INSERTION (N/A Spine Lumbar)     Patient location during evaluation: PACU Anesthesia Type: General Level of consciousness: awake and alert Pain management: pain level controlled Vital Signs Assessment: post-procedure vital signs reviewed and stable Respiratory status: spontaneous breathing, nonlabored ventilation, respiratory function stable and patient connected to nasal cannula oxygen Cardiovascular status: blood pressure returned to baseline and stable : some nausea and Rxd  Anesthetic complications: no    Last Vitals:  Vitals:   02/28/18 1352 02/28/18 1354  BP:  115/69  Pulse: 77 74  Resp: 10 15  Temp:    SpO2: 92% 97%    Last Pain:  Vitals:   02/28/18 1355  TempSrc:   PainSc: 6       LLE Sensation: Full sensation (02/28/18 1355)   RLE Sensation: Full sensation (02/28/18 1355)      Damaya Channing,JAMES TERRILL

## 2018-02-28 NOTE — Progress Notes (Signed)
Pharmacy Antibiotic Note  Sandy Garcia is a 47 y.o. female admitted on 02/28/2018 s/p back surgery, now for vanc post-op  No drain in place  Plan: vanc 1500 mg x 1  No need for further dosing with no drain in place  Height: 5\' 4"  (162.6 cm) Weight: 250 lb (113.4 kg) IBW/kg (Calculated) : 54.7  Temp (24hrs), Avg:97.7 F (36.5 C), Min:97.5 F (36.4 C), Max:97.7 F (36.5 C)  Recent Labs  Lab 02/28/18 0840  WBC 8.6  CREATININE 0.73    Estimated Creatinine Clearance: 110.8 mL/min (by C-G formula based on SCr of 0.73 mg/dL).    Allergies  Allergen Reactions  . Morphine And Related Other (See Comments)    Chest pain  . Latex Swelling    Redness, pain   . Sulfa Antibiotics     Skin crawling   . Ancef [Cefazolin] Rash    Caused facial redness like a "sun burn"   Levester Fresh, PharmD, BCPS, BCCCP Clinical Pharmacist Clinical phone for 02/28/2018 from [phone]: [phone] If after 2300, please call main pharmacy at: [phone] 02/28/2018 5:37 PM

## 2018-02-28 NOTE — Anesthesia Preprocedure Evaluation (Addendum)
Anesthesia Evaluation  Patient identified by MRN, date of birth, ID band Patient awake    Reviewed: Allergy & Precautions, NPO status , Patient's Chart, lab work & pertinent test results  History of Anesthesia Complications (+) history of anesthetic complications  Airway Mallampati: III  TM Distance: <3 FB Neck ROM: Full    Dental no notable dental hx. (+) Teeth Intact, Dental Advisory Given   Pulmonary shortness of breath, asthma ,    breath sounds clear to auscultation       Cardiovascular hypertension,  Rhythm:Regular Rate:Normal     Neuro/Psych    GI/Hepatic GERD  ,  Endo/Other  Morbid obesity  Renal/GU      Musculoskeletal  (+) Arthritis , Chronic pain syn   Abdominal (+) + obese,   Peds  Hematology   Anesthesia Other Findings   Reproductive/Obstetrics                           Anesthesia Physical Anesthesia Plan  ASA: III  Anesthesia Plan: General   Post-op Pain Management:    Induction: Intravenous  PONV Risk Score and Plan: 4 or greater and Treatment may vary due to age or medical condition, Dexamethasone and Ondansetron  Airway Management Planned: Oral ETT  Additional Equipment:   Intra-op Plan:   Post-operative Plan: Extubation in OR  Informed Consent: I have reviewed the patients History and Physical, chart, labs and discussed the procedure including the risks, benefits and alternatives for the proposed anesthesia with the patient or authorized representative who has indicated his/her understanding and acceptance.     Plan Discussed with:   Anesthesia Plan Comments:        Anesthesia Quick Evaluation

## 2018-03-01 DIAGNOSIS — G894 Chronic pain syndrome: Secondary | ICD-10-CM | POA: Diagnosis not present

## 2018-03-01 NOTE — Discharge Summary (Signed)
Physician Discharge Summary  Patient ID: Sandy Garcia MRN: [id number] DOB/AGE: August 14, 1971 47 y.o.  Admit date: 02/28/2018 Discharge date: 03/01/2018  Admission Diagnoses:  Discharge Diagnoses:  Active Problems:   Status post insertion of spinal cord stimulator   Hypoxia   Discharged Condition: good  Hospital Course: taken to the OR 4/12 for insertion of permanent SCS; surgery was uncomplicated, with minimal EBL, but in the PACU she continued to be hypoxic, with O2 2L [place] requirement. CXR was unremarkable, as was physical exam.  Admitted for overnight observation and pulmonary toilet, her effort and Sa02 improved. She was using incentive spirometer, and reports a "breathing treatment" was helpful.  Today, POD #1, she reports improved feeling, and respiratory effort is more comfortable. Sa02 is 98% on room air.   Consults: None; plan referral for sleep study as outpatient  Significant Diagnostic Studies: none  Treatments: respiratory therapy: O2 and albuterol/atropine nebulizer  Discharge Exam: Blood pressure (!) 106/59, pulse 71, temperature 97.6 F (36.4 C), temperature source Oral, resp. rate 16, height 5\' 4"  (1.626 m), weight 113.4 kg (250 lb), last menstrual period 01/09/2018, SpO2 97 %, not currently breastfeeding. General appearance: alert, cooperative and moderately obese Head: Normocephalic, without obvious abnormality, atraumatic Back: symmetric, no curvature. ROM normal. No CVA tenderness. Resp: clear to auscultation bilaterally Cardio: regular rate and rhythm, S1, S2 normal, no murmur, click, rub or gallop Pulses: 2+ and symmetric Skin: Skin color, texture, turgor normal. No rashes or lesions Incision/Wound:clean, dry, intact  Disposition: Discharge disposition: 01-Home or Self Care       Discharge Instructions    Call MD for:  redness, tenderness, or signs of infection (pain, swelling, redness, odor or green/yellow discharge around incision site)   Complete  by:  As directed    Call MD for:  severe uncontrolled pain   Complete by:  As directed    Call MD for:  temperature >100.4   Complete by:  As directed    Diet - low sodium heart healthy   Complete by:  As directed    Incentive spirometry RT   Complete by:  As directed    Increase activity slowly   Complete by:  As directed      Allergies as of 03/01/2018      Reactions   Morphine And Related Other (See Comments)   Chest pain   Latex Swelling   Redness, pain    Sulfa Antibiotics    Skin crawling    Ancef [cefazolin] Rash   Caused facial redness like a "sun burn"      Medication List    TAKE these medications   albuterol 108 (90 Base) MCG/ACT inhaler Commonly known as:  PROVENTIL HFA;VENTOLIN HFA Inhale 2 puffs into the lungs every 6 (six) hours as needed for wheezing or shortness of breath.   amLODipine-benazepril 5-20 MG capsule Commonly known as:  LOTREL Take 1 capsule by mouth daily.   amoxicillin-clavulanate 875-125 MG tablet Commonly known as:  AUGMENTIN Take 1 tablet by mouth 2 (two) times daily.   BIOFREEZE EX Apply 1 application topically daily as needed (foot pain).   bisoprolol-hydrochlorothiazide 10-6.25 MG tablet Commonly known as:  ZIAC Take 1 tablet by mouth daily.   buPROPion 300 MG 24 hr tablet Commonly known as:  WELLBUTRIN XL Take 300 mg by mouth daily.   cetirizine 10 MG tablet Commonly known as:  ZYRTEC Take 10 mg by mouth daily.   chlorpheniramine 4 MG tablet Commonly known as:  CHLOR-TRIMETON Take 4  mg by mouth daily as needed for allergies.   ciprofloxacin-dexamethasone OTIC suspension Commonly known as:  CIPRODEX Place 1-2 drops into both ears once as needed (ear pain).   clindamycin 150 MG capsule Commonly known as:  CLEOCIN Take 1 capsule (150 mg total) by mouth 3 (three) times daily for 10 doses.   cyclobenzaprine 10 MG tablet Commonly known as:  FLEXERIL Take 10 mg by mouth 2 (two) times daily as needed for muscle  spasms.   fluticasone 50 MCG/ACT nasal spray Commonly known as:  FLONASE Place 1 spray into both nostrils 2 (two) times daily as needed for allergies or rhinitis.   guaiFENesin 600 MG 12 hr tablet Commonly known as:  MUCINEX Take 600-1,200 mg by mouth 2 (two) times daily as needed.   HORIZANT 600 MG Tbcr Generic drug:  Gabapentin Enacarbil Take 600 mg by mouth 2 (two) times daily.   hydrochlorothiazide 12.5 MG capsule Commonly known as:  MICROZIDE Take 12.5 mg by mouth daily.   HYDROcodone-acetaminophen 5-325 MG tablet Commonly known as:  NORCO/VICODIN Take 1 tablet by mouth every 4 (four) hours as needed for up to 7 days for moderate pain. What changed:  how much to take   MUSCLE RUB EX Apply 1 application topically 2 (two) times daily as needed (pain). Pain Aid otc   omeprazole 40 MG capsule Commonly known as:  PRILOSEC Take 40 mg by mouth daily as needed (acid reflux).   OVER THE COUNTER MEDICATION Take by mouth.   predniSONE 20 MG tablet Commonly known as:  DELTASONE Take 40 mg by mouth daily. For 5 days   PROBIOTIC-PREBIOTIC PO Take 2 capsules by mouth daily.   ranitidine 150 MG tablet Commonly known as:  ZANTAC Take 150 mg by mouth 2 (two) times daily.   REFRESH OP Place 2-3 drops into both eyes daily as needed (dry eyes).   TIGER BALM EXTRA STRENGTH EX Apply 1 application topically daily as needed (shoulder pain).      Follow-up Information    Odette Fraction, MD Follow up.   Specialty:  Anesthesiology Contact information: [address] [place] [address] [place] [address] [phone]           Signed: Gwynne Edinger 03/01/2018, 8:39 AM

## 2018-03-01 NOTE — Progress Notes (Signed)
Patient alert and oriented, mae's well, voiding adequate amount of urine, swallowing without difficulty,  c/o  Mild pain at time of discharge. Patient discharged home with family. Script and discharged instructions given to patient. Patient and family stated understanding of instructions given. Patient has an appointment with Dr. Maryjean Ka

## 2018-03-03 ENCOUNTER — Encounter (HOSPITAL_COMMUNITY): Payer: Self-pay | Admitting: Anesthesiology

## 2019-02-03 DIAGNOSIS — I10 Essential (primary) hypertension: Secondary | ICD-10-CM | POA: Insufficient documentation

## 2019-02-03 DIAGNOSIS — G2581 Restless legs syndrome: Secondary | ICD-10-CM | POA: Insufficient documentation

## 2019-02-03 DIAGNOSIS — N63 Unspecified lump in unspecified breast: Secondary | ICD-10-CM | POA: Insufficient documentation

## 2019-02-03 DIAGNOSIS — N200 Calculus of kidney: Secondary | ICD-10-CM

## 2019-02-03 DIAGNOSIS — E785 Hyperlipidemia, unspecified: Secondary | ICD-10-CM | POA: Insufficient documentation

## 2019-02-03 DIAGNOSIS — B977 Papillomavirus as the cause of diseases classified elsewhere: Secondary | ICD-10-CM | POA: Insufficient documentation

## 2019-02-03 HISTORY — DX: Unspecified lump in unspecified breast: N63.0

## 2019-02-03 HISTORY — DX: Papillomavirus as the cause of diseases classified elsewhere: B97.7

## 2019-02-03 HISTORY — DX: Essential (primary) hypertension: I10

## 2019-02-03 HISTORY — DX: Restless legs syndrome: G25.81

## 2019-02-03 HISTORY — DX: Calculus of kidney: N20.0

## 2019-04-06 DIAGNOSIS — R87611 Atypical squamous cells cannot exclude high grade squamous intraepithelial lesion on cytologic smear of cervix (ASC-H): Secondary | ICD-10-CM

## 2019-04-06 HISTORY — DX: Atypical squamous cells cannot exclude high grade squamous intraepithelial lesion on cytologic smear of cervix (ASC-H): R87.611

## 2019-05-09 IMAGING — RF DG THORACIC SPINE 1V
1 series · 1 of 1 positions shown · non-contrast
Comparison: [place] [facility] bone scan 07/17/2004.

FLUOROSCOPY TIME:  2 minutes 22 seconds

CLINICAL DATA: 46-year-old female undergoing lower thoracic spinal
stimulator placement.

EXAM:
DG C-ARM 61-120 MIN; THORACIC SPINE - 1 VIEW

[Series 1: run · 1 of 1 slices shown]
[im 1/1]
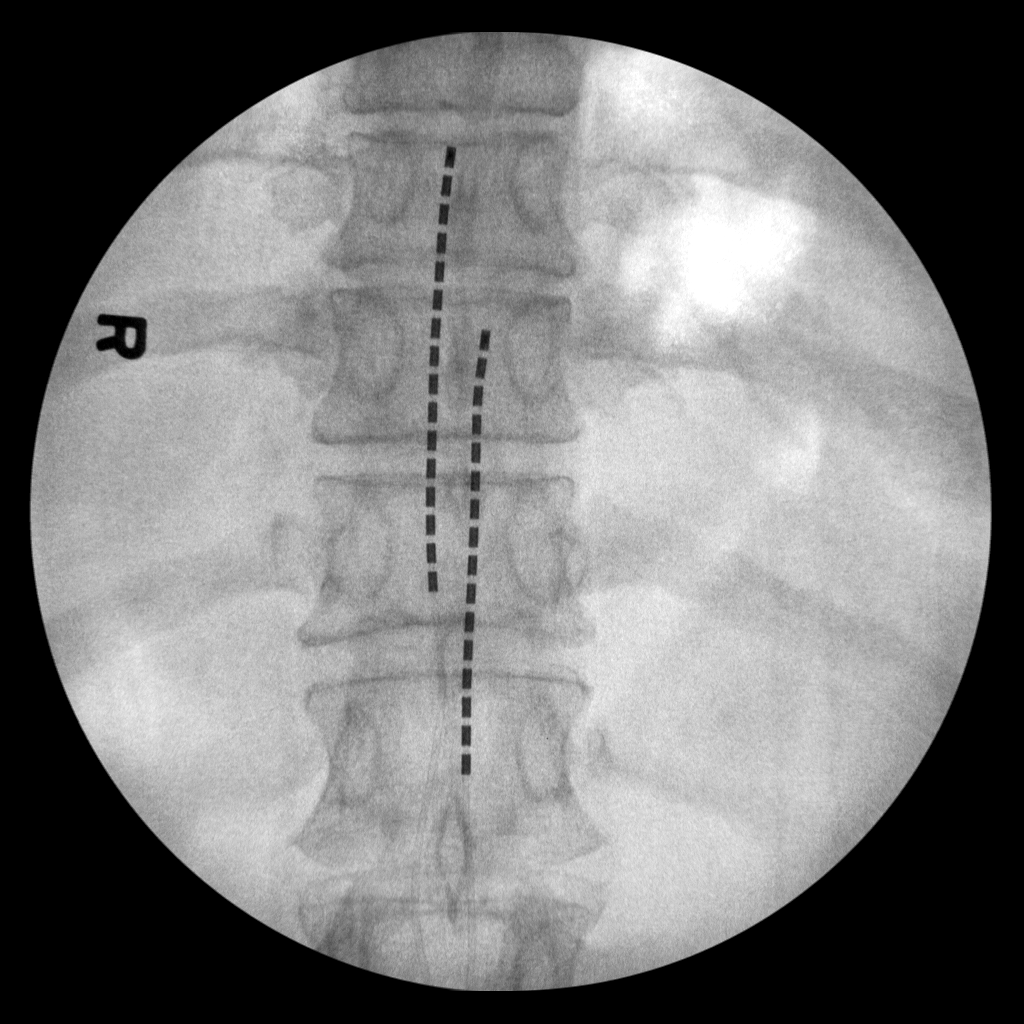

[1 of 1 positions shown; findings below may reference images not displayed]

FINDINGS: Single intraoperative fluoroscopic spot view of the lower thoracic
spine demonstrating midline spinal stimulator electrodes.
Insufficient bony landmarks for precise localization, but these
might span the T9 through T12 levels.
IMPRESSION: Lower thoracic spinal stimulator electrodes placed.

## 2019-05-09 IMAGING — DX DG CHEST 1V PORT
1 series · 1 of 1 positions shown · non-contrast
Comparison: 08/03/2008

CLINICAL DATA: Postoperative care status post spinal stimulator
placement

EXAM:
PORTABLE CHEST 1 VIEW

[chest ap]
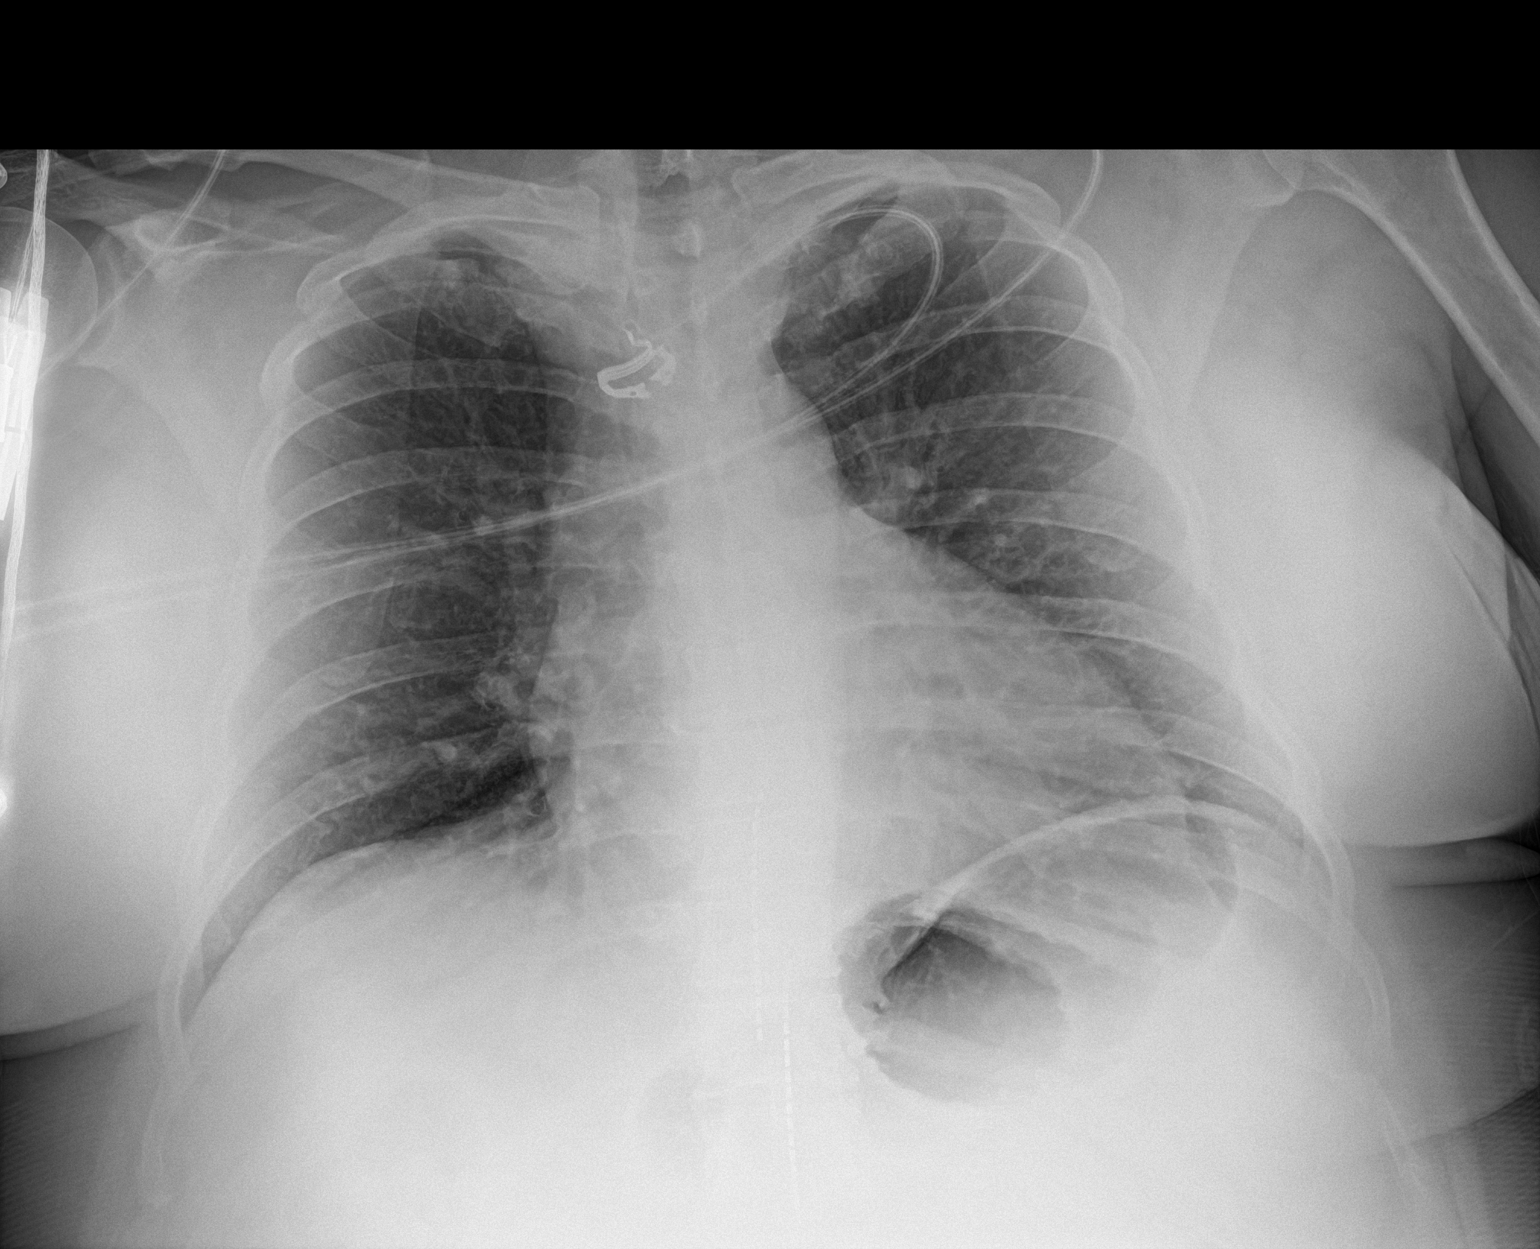

[1 of 1 positions shown; findings below may reference images not displayed]

FINDINGS: The heart size and mediastinal contours are within normal limits.
Both lungs are clear. The visualized skeletal structures are
unremarkable. Spinal stimulator projecting over the lower thoracic
spine.
IMPRESSION: No active disease.

## 2021-01-17 DIAGNOSIS — N938 Other specified abnormal uterine and vaginal bleeding: Secondary | ICD-10-CM | POA: Insufficient documentation

## 2021-01-17 HISTORY — DX: Other specified abnormal uterine and vaginal bleeding: N93.8

## 2021-12-26 ENCOUNTER — Other Ambulatory Visit: Payer: Self-pay

## 2021-12-27 ENCOUNTER — Encounter: Payer: Self-pay | Admitting: Cardiology

## 2021-12-27 ENCOUNTER — Ambulatory Visit (INDEPENDENT_AMBULATORY_CARE_PROVIDER_SITE_OTHER): Payer: Medicare (Managed Care) | Admitting: Cardiology

## 2021-12-27 ENCOUNTER — Other Ambulatory Visit: Payer: Self-pay

## 2021-12-27 VITALS — BP 128/90 | HR 67 | Ht 65.0 in | Wt 242.8 lb

## 2021-12-27 DIAGNOSIS — F411 Generalized anxiety disorder: Secondary | ICD-10-CM | POA: Diagnosis not present

## 2021-12-27 DIAGNOSIS — I10 Essential (primary) hypertension: Secondary | ICD-10-CM

## 2021-12-27 DIAGNOSIS — E782 Mixed hyperlipidemia: Secondary | ICD-10-CM
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
## 2021-12-27 NOTE — Patient Instructions (Signed)
Medication Instructions:  Your physician recommends that you continue on your current medications as directed. Please refer to the Current Medication list given to you today.  *If you need a refill on your cardiac medications before your next appointment, please call your pharmacy*   Lab Work: None If you have labs (blood work) drawn today and your tests are completely normal, you will receive your results only by: [place] (if you have MyChart) OR A paper copy in the mail If you have any lab test that is abnormal or we need to change your treatment, we will call you to review the results.   Testing/Procedures: Your physician has requested that you have an echocardiogram. Echocardiography is a painless test that uses sound waves to create images of your heart. It provides your doctor with information about the size and shape of your heart and how well your hearts chambers and valves are working. This procedure takes approximately one hour. There are no restrictions for this procedure.   Your physician has requested that you have a renal artery duplex. During this test, an ultrasound is used to evaluate blood flow to the kidneys. Allow one hour for this exam. Do not eat after midnight the day before and avoid carbonated beverages. Take your medications as you usually do.    Follow-Up: At [facility], you and your health needs are our priority.  As part of our continuing mission to provide you with exceptional heart care, we have created designated Provider Care Teams.  These Care Teams include your primary Cardiologist (physician) and Advanced Practice Providers (APPs -  Physician Assistants and Nurse Practitioners) who all work together to provide you with the care you need, when you need it.  We recommend signing up for the patient portal called "MyChart".  Sign up information is provided on this After Visit Summary.  MyChart is used to connect with patients for Virtual Visits  (Telemedicine).  Patients are able to view lab/test results, encounter notes, upcoming appointments, etc.  Non-urgent messages can be sent to your provider as well.   To learn more about what you can do with MyChart, go to [email or web address].    Your next appointment:   2 month(s)  The format for your next appointment:   In Person  Provider:   Jenne Campus, MD    Other Instructions 24 hour blood pressure monitor ordered.

## 2022-01-02 ENCOUNTER — Ambulatory Visit (INDEPENDENT_AMBULATORY_CARE_PROVIDER_SITE_OTHER): Payer: Medicare (Managed Care)

## 2022-01-02 ENCOUNTER — Other Ambulatory Visit: Payer: Self-pay

## 2022-01-02 DIAGNOSIS — E782 Mixed hyperlipidemia: Secondary | ICD-10-CM | POA: Diagnosis not present

## 2022-01-02 DIAGNOSIS — I10 Essential (primary) hypertension: Secondary | ICD-10-CM

## 2022-01-02 LAB — ECHOCARDIOGRAM COMPLETE
Area-P 1/2: 4.1 cm2
S' Lateral: 3.1 cm

## 2022-01-08 ENCOUNTER — Ambulatory Visit (INDEPENDENT_AMBULATORY_CARE_PROVIDER_SITE_OTHER): Payer: Medicare (Managed Care)

## 2022-01-08 ENCOUNTER — Other Ambulatory Visit: Payer: Self-pay

## 2022-01-08 DIAGNOSIS — I10 Essential (primary) hypertension: Secondary | ICD-10-CM | POA: Diagnosis not present

## 2022-01-08 DIAGNOSIS — E782 Mixed hyperlipidemia: Secondary | ICD-10-CM

## 2022-01-10 ENCOUNTER — Other Ambulatory Visit: Payer: Self-pay | Admitting: Cardiology

## 2022-01-10 ENCOUNTER — Telehealth: Payer: Self-pay | Admitting: *Deleted

## 2022-01-10 DIAGNOSIS — R030 Elevated blood-pressure reading, without diagnosis of hypertension: Secondary | ICD-10-CM

## 2022-01-10 NOTE — Telephone Encounter (Signed)
Left message for pt to call us to schedule time to have 24 HR BP Monitor put on. Have opennings all day today or tomorrow.

## 2022-01-29 NOTE — Telephone Encounter (Signed)
Scheduled pt

## 2022-01-29 NOTE — Telephone Encounter (Signed)
Patient's mother is following up, requesting to reschedule BP Monitor appointment. Please assist as able.  ?

## 2022-02-16 ENCOUNTER — Telehealth: Payer: Self-pay | Admitting: Cardiology

## 2022-02-16 NOTE — Telephone Encounter (Signed)
Mother of the patient called. The patient's Mother wanted to know if the patient's appt could be moved to 02/20/22. The mother has an appointment that day, the patient also has a broken foot and it is difficult to transport the patient multiple days in a row ?

## 2022-02-16 NOTE — Telephone Encounter (Signed)
The patients appointment was moved to 02/20/22. ?

## 2022-02-20 ENCOUNTER — Ambulatory Visit (INDEPENDENT_AMBULATORY_CARE_PROVIDER_SITE_OTHER): Payer: Medicare (Managed Care)

## 2022-02-20 DIAGNOSIS — R030 Elevated blood-pressure reading, without diagnosis of hypertension: Secondary | ICD-10-CM

## 2022-02-28 ENCOUNTER — Encounter: Payer: Self-pay | Admitting: Cardiology

## 2022-02-28 ENCOUNTER — Ambulatory Visit (INDEPENDENT_AMBULATORY_CARE_PROVIDER_SITE_OTHER): Payer: Medicare (Managed Care) | Admitting: Cardiology

## 2022-02-28 ENCOUNTER — Ambulatory Visit: Payer: Medicare (Managed Care) | Admitting: Cardiology

## 2022-02-28 VITALS — BP 128/76 | HR 72 | Ht 65.0 in | Wt 245.0 lb

## 2022-02-28 DIAGNOSIS — F411 Generalized anxiety disorder: Secondary | ICD-10-CM | POA: Diagnosis not present

## 2022-02-28 DIAGNOSIS — I10 Essential (primary) hypertension: Secondary | ICD-10-CM

## 2022-02-28 DIAGNOSIS — G4733 Obstructive sleep apnea (adult) (pediatric): Secondary | ICD-10-CM | POA: Diagnosis not present

## 2022-02-28 DIAGNOSIS — E782 Mixed hyperlipidemia: Secondary | ICD-10-CM

## 2022-02-28 MED ORDER — SPIRONOLACTONE 25 MG PO TABS
12.5000 mg | ORAL_TABLET | Freq: Every day | ORAL | 3 refills | Status: DC
Start: 2022-02-28 — End: 2023-05-17
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
## 2022-02-28 NOTE — Patient Instructions (Signed)
Medication Instructions:  ?Your physician has recommended you make the following change in your medication:  ?Start Aldactone 12.5 mg once daily ? ?*If you need a refill on your cardiac medications before your next appointment, please call your pharmacy* ? ? ?Lab Work: ?Your physician recommends that you return for lab work in: 1 week 4/19 for a basic metabolic panel. You do not need an appt. You do not need to be fasting ? ?If you have labs (blood work) drawn today and your tests are completely normal, you will receive your results only by: ?MyChart Message (if you have MyChart) OR ?A paper copy in the mail ?If you have any lab test that is abnormal or we need to change your treatment, we will call you to review the results. ? ? ?Testing/Procedures: ?NONE ? ? ?Follow-Up: ?At [facility], you and your health needs are our priority.  As part of our continuing mission to provide you with exceptional heart care, we have created designated Provider Care Teams.  These Care Teams include your primary Cardiologist (physician) and Advanced Practice Providers (APPs -  Physician Assistants and Nurse Practitioners) who all work together to provide you with the care you need, when you need it. ? ?We recommend signing up for the patient portal called "MyChart".  Sign up information is provided on this After Visit Summary.  MyChart is used to connect with patients for Virtual Visits (Telemedicine).  Patients are able to view lab/test results, encounter notes, upcoming appointments, etc.  Non-urgent messages can be sent to your provider as well.   ?To learn more about what you can do with MyChart, go to [email or web address].   ? ?Your next appointment:   ?3 month(s) ? ?The format for your next appointment:   ?In Person ? ?Provider:   ?Jenne Campus, MD  ? ? ?Other Instructions ? ? ? ?Important Information About Sugar ? ? ? ? ?  ?

## 2022-04-27 ENCOUNTER — Telehealth: Payer: Self-pay

## 2022-04-27 ENCOUNTER — Telehealth: Payer: Self-pay | Admitting: Cardiology

## 2022-04-27 NOTE — Telephone Encounter (Signed)
Pt c/o medication issue:  1. Name of Medication: spironolactone (ALDACTONE) 25 MG tablet  2. How are you currently taking this medication (dosage and times per day)? As directed  3. Are you having a reaction (difficulty breathing--STAT)? No  4. What is your medication issue? Patient stated she wanted to try Metoprolol instead.  Please advise if this could be sent to her pharmacy today.

## 2022-04-27 NOTE — Telephone Encounter (Signed)
Spoke with pt who stated that she wanted to try the Metoprolol that she and Dr. Agustin Cree had discussed. He had wanted her to try the low dose Aldactone first then get blood work. She has only taken 2 or 3 doses of the Aldactone. Encouraged her to take th Aldactone as directed and keep a log of her blood pressures and call back in a few days to see how she is feeling and how her blood pressure readings are. She agreed and verbalized understanding.

## 2022-06-11 ENCOUNTER — Encounter: Payer: Self-pay | Admitting: Cardiology

## 2022-06-11 ENCOUNTER — Ambulatory Visit (INDEPENDENT_AMBULATORY_CARE_PROVIDER_SITE_OTHER): Payer: Medicare (Managed Care) | Admitting: Cardiology

## 2022-06-11 VITALS — BP 122/84 | HR 70 | Ht 64.0 in | Wt 240.4 lb

## 2022-06-11 DIAGNOSIS — I10 Essential (primary) hypertension: Secondary | ICD-10-CM

## 2022-06-11 DIAGNOSIS — G4733 Obstructive sleep apnea (adult) (pediatric): Secondary | ICD-10-CM | POA: Diagnosis not present

## 2022-06-11 DIAGNOSIS — F411 Generalized anxiety disorder: Secondary | ICD-10-CM | POA: Diagnosis not present

## 2022-06-11 NOTE — Patient Instructions (Signed)
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
## 2023-04-04 ENCOUNTER — Ambulatory Visit: Payer: Medicare (Managed Care) | Admitting: Cardiology

## 2023-05-01 DIAGNOSIS — M47816 Spondylosis without myelopathy or radiculopathy, lumbar region: Secondary | ICD-10-CM | POA: Diagnosis not present

## 2023-05-01 DIAGNOSIS — G629 Polyneuropathy, unspecified: Secondary | ICD-10-CM | POA: Diagnosis not present

## 2023-05-01 DIAGNOSIS — G90529 Complex regional pain syndrome I of unspecified lower limb: Secondary | ICD-10-CM | POA: Diagnosis not present

## 2023-05-01 DIAGNOSIS — Z79891 Long term (current) use of opiate analgesic: Secondary | ICD-10-CM | POA: Diagnosis not present

## 2023-05-01 DIAGNOSIS — M25551 Pain in right hip: Secondary | ICD-10-CM | POA: Diagnosis not present

## 2023-05-01 DIAGNOSIS — Z1389 Encounter for screening for other disorder: Secondary | ICD-10-CM | POA: Diagnosis not present

## 2023-05-02 DIAGNOSIS — R300 Dysuria: Secondary | ICD-10-CM | POA: Diagnosis not present

## 2023-05-10 DIAGNOSIS — R0602 Shortness of breath: Secondary | ICD-10-CM | POA: Diagnosis not present

## 2023-05-10 DIAGNOSIS — I10 Essential (primary) hypertension: Secondary | ICD-10-CM | POA: Diagnosis not present

## 2023-05-17 ENCOUNTER — Other Ambulatory Visit: Payer: Self-pay | Admitting: Cardiology

## 2023-05-20 DIAGNOSIS — G629 Polyneuropathy, unspecified: Secondary | ICD-10-CM | POA: Diagnosis not present

## 2023-05-20 DIAGNOSIS — Z1389 Encounter for screening for other disorder: Secondary | ICD-10-CM | POA: Diagnosis not present

## 2023-05-20 DIAGNOSIS — M47816 Spondylosis without myelopathy or radiculopathy, lumbar region: Secondary | ICD-10-CM | POA: Diagnosis not present

## 2023-05-20 DIAGNOSIS — Z79891 Long term (current) use of opiate analgesic: Secondary | ICD-10-CM | POA: Diagnosis not present

## 2023-05-20 DIAGNOSIS — G90529 Complex regional pain syndrome I of unspecified lower limb: Secondary | ICD-10-CM | POA: Diagnosis not present

## 2023-05-20 DIAGNOSIS — M25551 Pain in right hip: Secondary | ICD-10-CM | POA: Diagnosis not present

## 2023-05-22 DIAGNOSIS — N3091 Cystitis, unspecified with hematuria: Secondary | ICD-10-CM | POA: Diagnosis not present

## 2023-05-22 DIAGNOSIS — N3000 Acute cystitis without hematuria: Secondary | ICD-10-CM | POA: Diagnosis not present

## 2023-06-17 DIAGNOSIS — N760 Acute vaginitis: Secondary | ICD-10-CM | POA: Diagnosis not present

## 2023-06-18 DIAGNOSIS — N390 Urinary tract infection, site not specified: Secondary | ICD-10-CM | POA: Diagnosis not present

## 2023-06-18 DIAGNOSIS — Z79899 Other long term (current) drug therapy: Secondary | ICD-10-CM | POA: Diagnosis not present

## 2023-06-26 DIAGNOSIS — M25551 Pain in right hip: Secondary | ICD-10-CM | POA: Diagnosis not present

## 2023-06-26 DIAGNOSIS — Z79891 Long term (current) use of opiate analgesic: Secondary | ICD-10-CM | POA: Diagnosis not present

## 2023-06-26 DIAGNOSIS — Z1389 Encounter for screening for other disorder: Secondary | ICD-10-CM | POA: Diagnosis not present

## 2023-06-26 DIAGNOSIS — G629 Polyneuropathy, unspecified: Secondary | ICD-10-CM | POA: Diagnosis not present

## 2023-06-26 DIAGNOSIS — G90529 Complex regional pain syndrome I of unspecified lower limb: Secondary | ICD-10-CM | POA: Diagnosis not present

## 2023-06-26 DIAGNOSIS — M47816 Spondylosis without myelopathy or radiculopathy, lumbar region: Secondary | ICD-10-CM | POA: Diagnosis not present

## 2023-07-01 DIAGNOSIS — N390 Urinary tract infection, site not specified: Secondary | ICD-10-CM | POA: Diagnosis not present

## 2023-07-04 DIAGNOSIS — N390 Urinary tract infection, site not specified: Secondary | ICD-10-CM | POA: Diagnosis not present

## 2023-07-05 DIAGNOSIS — N390 Urinary tract infection, site not specified: Secondary | ICD-10-CM | POA: Diagnosis not present

## 2023-07-06 DIAGNOSIS — N390 Urinary tract infection, site not specified: Secondary | ICD-10-CM | POA: Diagnosis not present

## 2023-07-07 DIAGNOSIS — N390 Urinary tract infection, site not specified: Secondary | ICD-10-CM | POA: Diagnosis not present

## 2023-07-08 DIAGNOSIS — N390 Urinary tract infection, site not specified: Secondary | ICD-10-CM | POA: Diagnosis not present

## 2023-07-09 DIAGNOSIS — N390 Urinary tract infection, site not specified: Secondary | ICD-10-CM | POA: Diagnosis not present

## 2023-07-10 DIAGNOSIS — N390 Urinary tract infection, site not specified: Secondary | ICD-10-CM | POA: Diagnosis not present

## 2023-07-16 DIAGNOSIS — N390 Urinary tract infection, site not specified: Secondary | ICD-10-CM | POA: Diagnosis not present

## 2023-07-19 DIAGNOSIS — N390 Urinary tract infection, site not specified: Secondary | ICD-10-CM | POA: Diagnosis not present

## 2023-07-20 DIAGNOSIS — N390 Urinary tract infection, site not specified: Secondary | ICD-10-CM | POA: Diagnosis not present

## 2023-07-21 DIAGNOSIS — N390 Urinary tract infection, site not specified: Secondary | ICD-10-CM | POA: Diagnosis not present

## 2023-07-22 DIAGNOSIS — N390 Urinary tract infection, site not specified: Secondary | ICD-10-CM | POA: Diagnosis not present

## 2023-07-23 DIAGNOSIS — N390 Urinary tract infection, site not specified: Secondary | ICD-10-CM | POA: Diagnosis not present

## 2023-07-24 DIAGNOSIS — N390 Urinary tract infection, site not specified: Secondary | ICD-10-CM | POA: Diagnosis not present

## 2023-07-25 DIAGNOSIS — N390 Urinary tract infection, site not specified: Secondary | ICD-10-CM | POA: Diagnosis not present

## 2023-07-30 DIAGNOSIS — N200 Calculus of kidney: Secondary | ICD-10-CM | POA: Diagnosis not present

## 2023-07-30 DIAGNOSIS — Z87442 Personal history of urinary calculi: Secondary | ICD-10-CM | POA: Diagnosis not present

## 2023-07-30 DIAGNOSIS — N2889 Other specified disorders of kidney and ureter: Secondary | ICD-10-CM | POA: Diagnosis not present

## 2023-07-30 DIAGNOSIS — N390 Urinary tract infection, site not specified: Secondary | ICD-10-CM | POA: Diagnosis not present

## 2023-07-30 DIAGNOSIS — I878 Other specified disorders of veins: Secondary | ICD-10-CM | POA: Diagnosis not present

## 2023-07-31 DIAGNOSIS — G90529 Complex regional pain syndrome I of unspecified lower limb: Secondary | ICD-10-CM | POA: Diagnosis not present

## 2023-07-31 DIAGNOSIS — Z1389 Encounter for screening for other disorder: Secondary | ICD-10-CM | POA: Diagnosis not present

## 2023-07-31 DIAGNOSIS — M25551 Pain in right hip: Secondary | ICD-10-CM | POA: Diagnosis not present

## 2023-07-31 DIAGNOSIS — Z79891 Long term (current) use of opiate analgesic: Secondary | ICD-10-CM | POA: Diagnosis not present

## 2023-07-31 DIAGNOSIS — M47816 Spondylosis without myelopathy or radiculopathy, lumbar region: Secondary | ICD-10-CM | POA: Diagnosis not present

## 2023-07-31 DIAGNOSIS — G629 Polyneuropathy, unspecified: Secondary | ICD-10-CM | POA: Diagnosis not present

## 2023-08-01 DIAGNOSIS — H353220 Exudative age-related macular degeneration, left eye, stage unspecified: Secondary | ICD-10-CM | POA: Diagnosis not present

## 2023-08-19 DIAGNOSIS — H35353 Cystoid macular degeneration, bilateral: Secondary | ICD-10-CM | POA: Diagnosis not present

## 2023-08-26 DIAGNOSIS — Z01419 Encounter for gynecological examination (general) (routine) without abnormal findings: Secondary | ICD-10-CM | POA: Diagnosis not present

## 2023-08-26 DIAGNOSIS — N938 Other specified abnormal uterine and vaginal bleeding: Secondary | ICD-10-CM | POA: Diagnosis not present

## 2023-08-26 DIAGNOSIS — Z1231 Encounter for screening mammogram for malignant neoplasm of breast: Secondary | ICD-10-CM | POA: Diagnosis not present

## 2023-08-27 DIAGNOSIS — N200 Calculus of kidney: Secondary | ICD-10-CM | POA: Diagnosis not present

## 2023-08-28 DIAGNOSIS — G894 Chronic pain syndrome: Secondary | ICD-10-CM | POA: Diagnosis not present

## 2023-08-28 DIAGNOSIS — Z1389 Encounter for screening for other disorder: Secondary | ICD-10-CM | POA: Diagnosis not present

## 2023-08-28 DIAGNOSIS — Z79891 Long term (current) use of opiate analgesic: Secondary | ICD-10-CM | POA: Diagnosis not present

## 2023-08-28 DIAGNOSIS — M47816 Spondylosis without myelopathy or radiculopathy, lumbar region: Secondary | ICD-10-CM | POA: Diagnosis not present

## 2023-08-28 DIAGNOSIS — G629 Polyneuropathy, unspecified: Secondary | ICD-10-CM | POA: Diagnosis not present

## 2023-08-28 DIAGNOSIS — M25551 Pain in right hip: Secondary | ICD-10-CM | POA: Diagnosis not present

## 2023-08-28 DIAGNOSIS — G90529 Complex regional pain syndrome I of unspecified lower limb: Secondary | ICD-10-CM | POA: Diagnosis not present

## 2023-09-09 DIAGNOSIS — R9389 Abnormal findings on diagnostic imaging of other specified body structures: Secondary | ICD-10-CM | POA: Diagnosis not present

## 2023-09-09 DIAGNOSIS — N938 Other specified abnormal uterine and vaginal bleeding: Secondary | ICD-10-CM | POA: Diagnosis not present

## 2023-09-23 DIAGNOSIS — H35353 Cystoid macular degeneration, bilateral: Secondary | ICD-10-CM | POA: Diagnosis not present

## 2023-09-25 DIAGNOSIS — Z79891 Long term (current) use of opiate analgesic: Secondary | ICD-10-CM | POA: Diagnosis not present

## 2023-09-25 DIAGNOSIS — G90529 Complex regional pain syndrome I of unspecified lower limb: Secondary | ICD-10-CM | POA: Diagnosis not present

## 2023-09-25 DIAGNOSIS — Z1389 Encounter for screening for other disorder: Secondary | ICD-10-CM | POA: Diagnosis not present

## 2023-09-25 DIAGNOSIS — M47816 Spondylosis without myelopathy or radiculopathy, lumbar region: Secondary | ICD-10-CM | POA: Diagnosis not present

## 2023-09-25 DIAGNOSIS — G629 Polyneuropathy, unspecified: Secondary | ICD-10-CM | POA: Diagnosis not present

## 2023-09-25 DIAGNOSIS — M25551 Pain in right hip: Secondary | ICD-10-CM | POA: Diagnosis not present

## 2023-10-15 DIAGNOSIS — N644 Mastodynia: Secondary | ICD-10-CM | POA: Diagnosis not present

## 2023-10-16 DIAGNOSIS — M1712 Unilateral primary osteoarthritis, left knee: Secondary | ICD-10-CM | POA: Diagnosis not present

## 2023-10-16 DIAGNOSIS — M1711 Unilateral primary osteoarthritis, right knee: Secondary | ICD-10-CM | POA: Diagnosis not present

## 2023-10-23 DIAGNOSIS — N938 Other specified abnormal uterine and vaginal bleeding: Secondary | ICD-10-CM | POA: Diagnosis not present

## 2023-10-23 DIAGNOSIS — Z1389 Encounter for screening for other disorder: Secondary | ICD-10-CM | POA: Diagnosis not present

## 2023-10-23 DIAGNOSIS — M47816 Spondylosis without myelopathy or radiculopathy, lumbar region: Secondary | ICD-10-CM | POA: Diagnosis not present

## 2023-10-23 DIAGNOSIS — Z79891 Long term (current) use of opiate analgesic: Secondary | ICD-10-CM | POA: Diagnosis not present

## 2023-10-23 DIAGNOSIS — M25551 Pain in right hip: Secondary | ICD-10-CM | POA: Diagnosis not present

## 2023-10-23 DIAGNOSIS — R9389 Abnormal findings on diagnostic imaging of other specified body structures: Secondary | ICD-10-CM | POA: Diagnosis not present

## 2023-10-23 DIAGNOSIS — G629 Polyneuropathy, unspecified: Secondary | ICD-10-CM | POA: Diagnosis not present

## 2023-10-23 DIAGNOSIS — G90529 Complex regional pain syndrome I of unspecified lower limb: Secondary | ICD-10-CM | POA: Diagnosis not present

## 2023-10-25 DIAGNOSIS — N200 Calculus of kidney: Secondary | ICD-10-CM | POA: Diagnosis not present

## 2023-10-26 DIAGNOSIS — Z87442 Personal history of urinary calculi: Secondary | ICD-10-CM | POA: Diagnosis not present

## 2023-10-29 DIAGNOSIS — J4530 Mild persistent asthma, uncomplicated: Secondary | ICD-10-CM | POA: Diagnosis not present

## 2023-10-29 DIAGNOSIS — G4733 Obstructive sleep apnea (adult) (pediatric): Secondary | ICD-10-CM | POA: Diagnosis not present

## 2023-10-29 DIAGNOSIS — R5383 Other fatigue: Secondary | ICD-10-CM | POA: Diagnosis not present

## 2023-10-31 DIAGNOSIS — J329 Chronic sinusitis, unspecified: Secondary | ICD-10-CM | POA: Diagnosis not present

## 2023-11-05 DIAGNOSIS — I10 Essential (primary) hypertension: Secondary | ICD-10-CM | POA: Diagnosis not present

## 2023-11-05 DIAGNOSIS — N938 Other specified abnormal uterine and vaginal bleeding: Secondary | ICD-10-CM | POA: Diagnosis not present

## 2023-11-05 DIAGNOSIS — N950 Postmenopausal bleeding: Secondary | ICD-10-CM | POA: Diagnosis not present

## 2023-11-05 DIAGNOSIS — R9389 Abnormal findings on diagnostic imaging of other specified body structures: Secondary | ICD-10-CM | POA: Diagnosis not present

## 2023-11-05 DIAGNOSIS — C541 Malignant neoplasm of endometrium: Secondary | ICD-10-CM | POA: Diagnosis not present

## 2023-11-11 DIAGNOSIS — N342 Other urethritis: Secondary | ICD-10-CM | POA: Diagnosis not present

## 2023-11-11 DIAGNOSIS — R300 Dysuria: Secondary | ICD-10-CM | POA: Diagnosis not present

## 2023-11-11 DIAGNOSIS — C541 Malignant neoplasm of endometrium: Secondary | ICD-10-CM | POA: Diagnosis not present

## 2023-11-22 DIAGNOSIS — Z136 Encounter for screening for cardiovascular disorders: Secondary | ICD-10-CM | POA: Diagnosis not present

## 2023-11-22 DIAGNOSIS — N390 Urinary tract infection, site not specified: Secondary | ICD-10-CM | POA: Diagnosis not present

## 2023-11-22 DIAGNOSIS — N200 Calculus of kidney: Secondary | ICD-10-CM | POA: Diagnosis not present

## 2023-11-22 DIAGNOSIS — C541 Malignant neoplasm of endometrium: Secondary | ICD-10-CM | POA: Diagnosis not present

## 2023-11-22 DIAGNOSIS — Z01818 Encounter for other preprocedural examination: Secondary | ICD-10-CM | POA: Diagnosis not present

## 2023-11-29 DIAGNOSIS — N390 Urinary tract infection, site not specified: Secondary | ICD-10-CM | POA: Diagnosis not present

## 2023-11-29 DIAGNOSIS — N200 Calculus of kidney: Secondary | ICD-10-CM | POA: Diagnosis not present

## 2023-12-04 DIAGNOSIS — M47816 Spondylosis without myelopathy or radiculopathy, lumbar region: Secondary | ICD-10-CM | POA: Diagnosis not present

## 2023-12-04 DIAGNOSIS — Z1389 Encounter for screening for other disorder: Secondary | ICD-10-CM | POA: Diagnosis not present

## 2023-12-04 DIAGNOSIS — G90529 Complex regional pain syndrome I of unspecified lower limb: Secondary | ICD-10-CM | POA: Diagnosis not present

## 2023-12-04 DIAGNOSIS — Z79891 Long term (current) use of opiate analgesic: Secondary | ICD-10-CM | POA: Diagnosis not present

## 2023-12-04 DIAGNOSIS — G629 Polyneuropathy, unspecified: Secondary | ICD-10-CM | POA: Diagnosis not present

## 2023-12-04 DIAGNOSIS — M25551 Pain in right hip: Secondary | ICD-10-CM | POA: Diagnosis not present

## 2023-12-06 DIAGNOSIS — Z08 Encounter for follow-up examination after completed treatment for malignant neoplasm: Secondary | ICD-10-CM | POA: Diagnosis not present

## 2023-12-06 DIAGNOSIS — N83291 Other ovarian cyst, right side: Secondary | ICD-10-CM | POA: Diagnosis not present

## 2023-12-06 DIAGNOSIS — C541 Malignant neoplasm of endometrium: Secondary | ICD-10-CM | POA: Diagnosis not present

## 2023-12-06 DIAGNOSIS — Z8542 Personal history of malignant neoplasm of other parts of uterus: Secondary | ICD-10-CM | POA: Diagnosis not present

## 2023-12-06 DIAGNOSIS — N200 Calculus of kidney: Secondary | ICD-10-CM | POA: Diagnosis not present

## 2023-12-06 DIAGNOSIS — K760 Fatty (change of) liver, not elsewhere classified: Secondary | ICD-10-CM | POA: Diagnosis not present

## 2023-12-09 DIAGNOSIS — N390 Urinary tract infection, site not specified: Secondary | ICD-10-CM | POA: Diagnosis not present

## 2023-12-11 DIAGNOSIS — C541 Malignant neoplasm of endometrium: Secondary | ICD-10-CM | POA: Diagnosis not present

## 2023-12-16 DIAGNOSIS — N390 Urinary tract infection, site not specified: Secondary | ICD-10-CM | POA: Diagnosis not present

## 2023-12-19 DIAGNOSIS — N83292 Other ovarian cyst, left side: Secondary | ICD-10-CM | POA: Diagnosis not present

## 2023-12-19 DIAGNOSIS — N888 Other specified noninflammatory disorders of cervix uteri: Secondary | ICD-10-CM | POA: Diagnosis not present

## 2023-12-19 DIAGNOSIS — Z881 Allergy status to other antibiotic agents status: Secondary | ICD-10-CM | POA: Diagnosis not present

## 2023-12-19 DIAGNOSIS — Z882 Allergy status to sulfonamides status: Secondary | ICD-10-CM | POA: Diagnosis not present

## 2023-12-19 DIAGNOSIS — N83291 Other ovarian cyst, right side: Secondary | ICD-10-CM | POA: Diagnosis not present

## 2023-12-19 DIAGNOSIS — C541 Malignant neoplasm of endometrium: Secondary | ICD-10-CM | POA: Diagnosis not present

## 2023-12-19 DIAGNOSIS — N83202 Unspecified ovarian cyst, left side: Secondary | ICD-10-CM | POA: Diagnosis not present

## 2023-12-19 DIAGNOSIS — I10 Essential (primary) hypertension: Secondary | ICD-10-CM | POA: Diagnosis not present

## 2023-12-19 DIAGNOSIS — N83201 Unspecified ovarian cyst, right side: Secondary | ICD-10-CM | POA: Diagnosis not present

## 2023-12-19 DIAGNOSIS — G4733 Obstructive sleep apnea (adult) (pediatric): Secondary | ICD-10-CM | POA: Diagnosis not present

## 2023-12-19 DIAGNOSIS — Z79899 Other long term (current) drug therapy: Secondary | ICD-10-CM | POA: Diagnosis not present

## 2023-12-19 DIAGNOSIS — N838 Other noninflammatory disorders of ovary, fallopian tube and broad ligament: Secondary | ICD-10-CM | POA: Diagnosis not present

## 2023-12-19 DIAGNOSIS — Z91040 Latex allergy status: Secondary | ICD-10-CM | POA: Diagnosis not present

## 2023-12-19 DIAGNOSIS — Z885 Allergy status to narcotic agent status: Secondary | ICD-10-CM | POA: Diagnosis not present

## 2023-12-30 DIAGNOSIS — Z1389 Encounter for screening for other disorder: Secondary | ICD-10-CM | POA: Diagnosis not present

## 2023-12-30 DIAGNOSIS — Z79891 Long term (current) use of opiate analgesic: Secondary | ICD-10-CM | POA: Diagnosis not present

## 2023-12-30 DIAGNOSIS — G629 Polyneuropathy, unspecified: Secondary | ICD-10-CM | POA: Diagnosis not present

## 2023-12-30 DIAGNOSIS — M47816 Spondylosis without myelopathy or radiculopathy, lumbar region: Secondary | ICD-10-CM | POA: Diagnosis not present

## 2023-12-30 DIAGNOSIS — M25551 Pain in right hip: Secondary | ICD-10-CM | POA: Diagnosis not present

## 2023-12-30 DIAGNOSIS — G90529 Complex regional pain syndrome I of unspecified lower limb: Secondary | ICD-10-CM | POA: Diagnosis not present

## 2024-01-09 DIAGNOSIS — C541 Malignant neoplasm of endometrium: Secondary | ICD-10-CM | POA: Diagnosis not present

## 2024-01-21 DIAGNOSIS — Z1231 Encounter for screening mammogram for malignant neoplasm of breast: Secondary | ICD-10-CM | POA: Diagnosis not present

## 2024-01-24 DIAGNOSIS — Z87440 Personal history of urinary (tract) infections: Secondary | ICD-10-CM | POA: Diagnosis not present

## 2024-01-24 DIAGNOSIS — N200 Calculus of kidney: Secondary | ICD-10-CM | POA: Diagnosis not present

## 2024-01-27 DIAGNOSIS — Z1389 Encounter for screening for other disorder: Secondary | ICD-10-CM | POA: Diagnosis not present

## 2024-01-27 DIAGNOSIS — M47816 Spondylosis without myelopathy or radiculopathy, lumbar region: Secondary | ICD-10-CM | POA: Diagnosis not present

## 2024-01-27 DIAGNOSIS — Z79891 Long term (current) use of opiate analgesic: Secondary | ICD-10-CM | POA: Diagnosis not present

## 2024-01-27 DIAGNOSIS — G629 Polyneuropathy, unspecified: Secondary | ICD-10-CM | POA: Diagnosis not present

## 2024-01-27 DIAGNOSIS — M25551 Pain in right hip: Secondary | ICD-10-CM | POA: Diagnosis not present

## 2024-01-27 DIAGNOSIS — G90529 Complex regional pain syndrome I of unspecified lower limb: Secondary | ICD-10-CM | POA: Diagnosis not present

## 2024-01-29 DIAGNOSIS — N390 Urinary tract infection, site not specified: Secondary | ICD-10-CM | POA: Diagnosis not present

## 2024-01-29 DIAGNOSIS — N200 Calculus of kidney: Secondary | ICD-10-CM | POA: Diagnosis not present

## 2024-01-29 DIAGNOSIS — C541 Malignant neoplasm of endometrium: Secondary | ICD-10-CM | POA: Diagnosis not present

## 2024-01-31 DIAGNOSIS — N390 Urinary tract infection, site not specified: Secondary | ICD-10-CM | POA: Diagnosis not present

## 2024-01-31 DIAGNOSIS — N289 Disorder of kidney and ureter, unspecified: Secondary | ICD-10-CM | POA: Diagnosis not present

## 2024-01-31 DIAGNOSIS — N200 Calculus of kidney: Secondary | ICD-10-CM | POA: Diagnosis not present

## 2024-01-31 DIAGNOSIS — R8290 Unspecified abnormal findings in urine: Secondary | ICD-10-CM | POA: Diagnosis not present

## 2024-02-10 DIAGNOSIS — N390 Urinary tract infection, site not specified: Secondary | ICD-10-CM | POA: Diagnosis not present

## 2024-02-10 DIAGNOSIS — N200 Calculus of kidney: Secondary | ICD-10-CM | POA: Diagnosis not present

## 2024-02-26 DIAGNOSIS — H34832 Tributary (branch) retinal vein occlusion, left eye, with macular edema: Secondary | ICD-10-CM | POA: Diagnosis not present

## 2024-02-27 DIAGNOSIS — M792 Neuralgia and neuritis, unspecified: Secondary | ICD-10-CM | POA: Diagnosis not present

## 2024-02-27 DIAGNOSIS — G8929 Other chronic pain: Secondary | ICD-10-CM | POA: Diagnosis not present

## 2024-02-27 DIAGNOSIS — G9050 Complex regional pain syndrome I, unspecified: Secondary | ICD-10-CM | POA: Diagnosis not present

## 2024-02-27 DIAGNOSIS — M79606 Pain in leg, unspecified: Secondary | ICD-10-CM | POA: Diagnosis not present

## 2024-02-27 DIAGNOSIS — M79671 Pain in right foot: Secondary | ICD-10-CM | POA: Diagnosis not present

## 2024-02-27 DIAGNOSIS — M79672 Pain in left foot: Secondary | ICD-10-CM | POA: Diagnosis not present

## 2024-02-27 DIAGNOSIS — M79605 Pain in left leg: Secondary | ICD-10-CM | POA: Diagnosis not present

## 2024-02-27 DIAGNOSIS — M79604 Pain in right leg: Secondary | ICD-10-CM | POA: Diagnosis not present

## 2024-02-27 DIAGNOSIS — S99929A Unspecified injury of unspecified foot, initial encounter: Secondary | ICD-10-CM | POA: Diagnosis not present

## 2024-02-27 DIAGNOSIS — S93609A Unspecified sprain of unspecified foot, initial encounter: Secondary | ICD-10-CM | POA: Diagnosis not present

## 2024-03-02 DIAGNOSIS — M25551 Pain in right hip: Secondary | ICD-10-CM | POA: Diagnosis not present

## 2024-03-02 DIAGNOSIS — G629 Polyneuropathy, unspecified: Secondary | ICD-10-CM | POA: Diagnosis not present

## 2024-03-02 DIAGNOSIS — Z1389 Encounter for screening for other disorder: Secondary | ICD-10-CM | POA: Diagnosis not present

## 2024-03-02 DIAGNOSIS — N390 Urinary tract infection, site not specified: Secondary | ICD-10-CM | POA: Diagnosis not present

## 2024-03-02 DIAGNOSIS — G90529 Complex regional pain syndrome I of unspecified lower limb: Secondary | ICD-10-CM | POA: Diagnosis not present

## 2024-03-02 DIAGNOSIS — M47816 Spondylosis without myelopathy or radiculopathy, lumbar region: Secondary | ICD-10-CM | POA: Diagnosis not present

## 2024-03-02 DIAGNOSIS — Z79891 Long term (current) use of opiate analgesic: Secondary | ICD-10-CM | POA: Diagnosis not present

## 2024-03-03 DIAGNOSIS — R079 Chest pain, unspecified: Secondary | ICD-10-CM | POA: Diagnosis not present

## 2024-03-03 DIAGNOSIS — I10 Essential (primary) hypertension: Secondary | ICD-10-CM | POA: Diagnosis not present

## 2024-03-06 DIAGNOSIS — G4733 Obstructive sleep apnea (adult) (pediatric): Secondary | ICD-10-CM | POA: Diagnosis not present

## 2024-03-06 DIAGNOSIS — Z79899 Other long term (current) drug therapy: Secondary | ICD-10-CM | POA: Diagnosis not present

## 2024-03-06 DIAGNOSIS — N200 Calculus of kidney: Secondary | ICD-10-CM | POA: Diagnosis not present

## 2024-03-06 DIAGNOSIS — Z6841 Body Mass Index (BMI) 40.0 and over, adult: Secondary | ICD-10-CM | POA: Diagnosis not present

## 2024-03-06 DIAGNOSIS — I10 Essential (primary) hypertension: Secondary | ICD-10-CM | POA: Diagnosis not present

## 2024-03-12 DIAGNOSIS — N200 Calculus of kidney: Secondary | ICD-10-CM | POA: Diagnosis not present

## 2024-03-12 DIAGNOSIS — Z466 Encounter for fitting and adjustment of urinary device: Secondary | ICD-10-CM | POA: Diagnosis not present

## 2024-03-27 DIAGNOSIS — G8929 Other chronic pain: Secondary | ICD-10-CM | POA: Diagnosis not present

## 2024-03-27 DIAGNOSIS — M792 Neuralgia and neuritis, unspecified: Secondary | ICD-10-CM | POA: Diagnosis not present

## 2024-03-27 DIAGNOSIS — M79604 Pain in right leg: Secondary | ICD-10-CM | POA: Diagnosis not present

## 2024-03-27 DIAGNOSIS — M79672 Pain in left foot: Secondary | ICD-10-CM | POA: Diagnosis not present

## 2024-03-27 DIAGNOSIS — M79605 Pain in left leg: Secondary | ICD-10-CM | POA: Diagnosis not present

## 2024-03-27 DIAGNOSIS — M79606 Pain in leg, unspecified: Secondary | ICD-10-CM | POA: Diagnosis not present

## 2024-03-27 DIAGNOSIS — S93602D Unspecified sprain of left foot, subsequent encounter: Secondary | ICD-10-CM | POA: Diagnosis not present

## 2024-03-27 DIAGNOSIS — M79671 Pain in right foot: Secondary | ICD-10-CM | POA: Diagnosis not present

## 2024-03-27 DIAGNOSIS — G9050 Complex regional pain syndrome I, unspecified: Secondary | ICD-10-CM | POA: Diagnosis not present

## 2024-03-27 DIAGNOSIS — S99922D Unspecified injury of left foot, subsequent encounter: Secondary | ICD-10-CM | POA: Diagnosis not present

## 2024-04-06 DIAGNOSIS — S93602D Unspecified sprain of left foot, subsequent encounter: Secondary | ICD-10-CM | POA: Diagnosis not present

## 2024-04-06 DIAGNOSIS — S99922D Unspecified injury of left foot, subsequent encounter: Secondary | ICD-10-CM | POA: Diagnosis not present

## 2024-04-06 DIAGNOSIS — X58XXXD Exposure to other specified factors, subsequent encounter: Secondary | ICD-10-CM | POA: Diagnosis not present

## 2024-04-06 DIAGNOSIS — M79672 Pain in left foot: Secondary | ICD-10-CM | POA: Diagnosis not present

## 2024-04-08 DIAGNOSIS — H34832 Tributary (branch) retinal vein occlusion, left eye, with macular edema: Secondary | ICD-10-CM | POA: Diagnosis not present

## 2024-04-09 DIAGNOSIS — M76822 Posterior tibial tendinitis, left leg: Secondary | ICD-10-CM | POA: Diagnosis not present

## 2024-04-09 DIAGNOSIS — S96919A Strain of unspecified muscle and tendon at ankle and foot level, unspecified foot, initial encounter: Secondary | ICD-10-CM | POA: Diagnosis not present

## 2024-04-09 DIAGNOSIS — G894 Chronic pain syndrome: Secondary | ICD-10-CM | POA: Diagnosis not present

## 2024-04-09 DIAGNOSIS — M47816 Spondylosis without myelopathy or radiculopathy, lumbar region: Secondary | ICD-10-CM | POA: Diagnosis not present

## 2024-04-09 DIAGNOSIS — G9050 Complex regional pain syndrome I, unspecified: Secondary | ICD-10-CM | POA: Diagnosis not present

## 2024-04-09 DIAGNOSIS — M25372 Other instability, left ankle: Secondary | ICD-10-CM | POA: Diagnosis not present

## 2024-04-09 DIAGNOSIS — Z79891 Long term (current) use of opiate analgesic: Secondary | ICD-10-CM | POA: Diagnosis not present

## 2024-04-09 DIAGNOSIS — M25572 Pain in left ankle and joints of left foot: Secondary | ICD-10-CM | POA: Diagnosis not present

## 2024-04-09 DIAGNOSIS — M792 Neuralgia and neuritis, unspecified: Secondary | ICD-10-CM | POA: Diagnosis not present

## 2024-04-15 DIAGNOSIS — N200 Calculus of kidney: Secondary | ICD-10-CM | POA: Diagnosis not present

## 2024-04-15 DIAGNOSIS — N289 Disorder of kidney and ureter, unspecified: Secondary | ICD-10-CM | POA: Diagnosis not present

## 2024-04-16 DIAGNOSIS — D225 Melanocytic nevi of trunk: Secondary | ICD-10-CM | POA: Diagnosis not present

## 2024-04-16 DIAGNOSIS — L821 Other seborrheic keratosis: Secondary | ICD-10-CM | POA: Diagnosis not present

## 2024-04-16 DIAGNOSIS — B352 Tinea manuum: Secondary | ICD-10-CM | POA: Diagnosis not present

## 2024-04-16 DIAGNOSIS — D485 Neoplasm of uncertain behavior of skin: Secondary | ICD-10-CM | POA: Diagnosis not present

## 2024-04-28 DIAGNOSIS — D0359 Melanoma in situ of other part of trunk: Secondary | ICD-10-CM | POA: Diagnosis not present

## 2024-05-11 DIAGNOSIS — M79605 Pain in left leg: Secondary | ICD-10-CM | POA: Diagnosis not present

## 2024-05-11 DIAGNOSIS — M25372 Other instability, left ankle: Secondary | ICD-10-CM | POA: Diagnosis not present

## 2024-05-11 DIAGNOSIS — M25371 Other instability, right ankle: Secondary | ICD-10-CM | POA: Diagnosis not present

## 2024-05-11 DIAGNOSIS — M79604 Pain in right leg: Secondary | ICD-10-CM | POA: Diagnosis not present

## 2024-05-11 DIAGNOSIS — M79606 Pain in leg, unspecified: Secondary | ICD-10-CM | POA: Diagnosis not present

## 2024-05-11 DIAGNOSIS — G8929 Other chronic pain: Secondary | ICD-10-CM | POA: Diagnosis not present

## 2024-05-11 DIAGNOSIS — M79672 Pain in left foot: Secondary | ICD-10-CM | POA: Diagnosis not present

## 2024-05-11 DIAGNOSIS — M76822 Posterior tibial tendinitis, left leg: Secondary | ICD-10-CM | POA: Diagnosis not present

## 2024-05-11 DIAGNOSIS — M79671 Pain in right foot: Secondary | ICD-10-CM | POA: Diagnosis not present

## 2024-05-12 DIAGNOSIS — M47816 Spondylosis without myelopathy or radiculopathy, lumbar region: Secondary | ICD-10-CM | POA: Diagnosis not present

## 2024-05-15 DIAGNOSIS — H34832 Tributary (branch) retinal vein occlusion, left eye, with macular edema: Secondary | ICD-10-CM | POA: Diagnosis not present

## 2024-05-20 DIAGNOSIS — J0120 Acute ethmoidal sinusitis, unspecified: Secondary | ICD-10-CM | POA: Diagnosis not present

## 2024-05-20 DIAGNOSIS — Z85820 Personal history of malignant melanoma of skin: Secondary | ICD-10-CM | POA: Diagnosis not present

## 2024-05-20 DIAGNOSIS — Z1331 Encounter for screening for depression: Secondary | ICD-10-CM | POA: Diagnosis not present

## 2024-05-20 DIAGNOSIS — Z9889 Other specified postprocedural states: Secondary | ICD-10-CM | POA: Diagnosis not present

## 2024-05-20 DIAGNOSIS — Z6839 Body mass index (BMI) 39.0-39.9, adult: Secondary | ICD-10-CM | POA: Diagnosis not present

## 2024-05-20 DIAGNOSIS — Z1339 Encounter for screening examination for other mental health and behavioral disorders: Secondary | ICD-10-CM | POA: Diagnosis not present

## 2024-05-28 DIAGNOSIS — D485 Neoplasm of uncertain behavior of skin: Secondary | ICD-10-CM | POA: Diagnosis not present

## 2024-05-28 DIAGNOSIS — Z85820 Personal history of malignant melanoma of skin: Secondary | ICD-10-CM | POA: Diagnosis not present

## 2024-05-28 DIAGNOSIS — L814 Other melanin hyperpigmentation: Secondary | ICD-10-CM | POA: Diagnosis not present

## 2024-05-28 DIAGNOSIS — D225 Melanocytic nevi of trunk: Secondary | ICD-10-CM | POA: Diagnosis not present

## 2024-06-04 DIAGNOSIS — N200 Calculus of kidney: Secondary | ICD-10-CM | POA: Diagnosis not present

## 2024-06-04 DIAGNOSIS — K429 Umbilical hernia without obstruction or gangrene: Secondary | ICD-10-CM | POA: Diagnosis not present

## 2024-06-04 DIAGNOSIS — Z01812 Encounter for preprocedural laboratory examination: Secondary | ICD-10-CM | POA: Diagnosis not present

## 2024-06-04 DIAGNOSIS — I10 Essential (primary) hypertension: Secondary | ICD-10-CM | POA: Diagnosis not present

## 2024-06-04 DIAGNOSIS — N281 Cyst of kidney, acquired: Secondary | ICD-10-CM | POA: Diagnosis not present

## 2024-06-04 DIAGNOSIS — K4090 Unilateral inguinal hernia, without obstruction or gangrene, not specified as recurrent: Secondary | ICD-10-CM | POA: Diagnosis not present

## 2024-06-05 DIAGNOSIS — N289 Disorder of kidney and ureter, unspecified: Secondary | ICD-10-CM | POA: Diagnosis not present

## 2024-06-05 DIAGNOSIS — C541 Malignant neoplasm of endometrium: Secondary | ICD-10-CM | POA: Diagnosis not present

## 2024-06-10 DIAGNOSIS — J4520 Mild intermittent asthma, uncomplicated: Secondary | ICD-10-CM | POA: Diagnosis not present

## 2024-06-10 DIAGNOSIS — G4733 Obstructive sleep apnea (adult) (pediatric): Secondary | ICD-10-CM | POA: Diagnosis not present

## 2024-06-10 DIAGNOSIS — R5383 Other fatigue: Secondary | ICD-10-CM | POA: Diagnosis not present

## 2024-06-11 DIAGNOSIS — Z0100 Encounter for examination of eyes and vision without abnormal findings: Secondary | ICD-10-CM | POA: Diagnosis not present

## 2024-06-11 DIAGNOSIS — H524 Presbyopia: Secondary | ICD-10-CM | POA: Diagnosis not present

## 2024-06-11 DIAGNOSIS — H353220 Exudative age-related macular degeneration, left eye, stage unspecified: Secondary | ICD-10-CM | POA: Diagnosis not present

## 2024-06-15 DIAGNOSIS — M47816 Spondylosis without myelopathy or radiculopathy, lumbar region: Secondary | ICD-10-CM | POA: Diagnosis not present

## 2024-06-19 DIAGNOSIS — H348322 Tributary (branch) retinal vein occlusion, left eye, stable: Secondary | ICD-10-CM | POA: Diagnosis not present

## 2024-07-08 DIAGNOSIS — I10 Essential (primary) hypertension: Secondary | ICD-10-CM | POA: Diagnosis not present

## 2024-07-08 DIAGNOSIS — R079 Chest pain, unspecified: Secondary | ICD-10-CM | POA: Diagnosis not present

## 2024-07-09 DIAGNOSIS — I10 Essential (primary) hypertension: Secondary | ICD-10-CM | POA: Diagnosis not present

## 2024-07-10 DIAGNOSIS — N200 Calculus of kidney: Secondary | ICD-10-CM | POA: Diagnosis not present

## 2024-07-14 DIAGNOSIS — Z6841 Body Mass Index (BMI) 40.0 and over, adult: Secondary | ICD-10-CM | POA: Diagnosis not present

## 2024-07-14 DIAGNOSIS — J0120 Acute ethmoidal sinusitis, unspecified: Secondary | ICD-10-CM | POA: Diagnosis not present

## 2024-07-14 DIAGNOSIS — H6091 Unspecified otitis externa, right ear: Secondary | ICD-10-CM | POA: Diagnosis not present

## 2024-07-14 DIAGNOSIS — Z8619 Personal history of other infectious and parasitic diseases: Secondary | ICD-10-CM | POA: Diagnosis not present

## 2024-07-17 DIAGNOSIS — N200 Calculus of kidney: Secondary | ICD-10-CM | POA: Diagnosis not present

## 2024-07-22 DIAGNOSIS — M47816 Spondylosis without myelopathy or radiculopathy, lumbar region: Secondary | ICD-10-CM | POA: Diagnosis not present

## 2024-07-24 DIAGNOSIS — M47816 Spondylosis without myelopathy or radiculopathy, lumbar region: Secondary | ICD-10-CM | POA: Diagnosis not present

## 2024-07-24 DIAGNOSIS — G9050 Complex regional pain syndrome I, unspecified: Secondary | ICD-10-CM | POA: Diagnosis not present

## 2024-07-27 DIAGNOSIS — G4733 Obstructive sleep apnea (adult) (pediatric): Secondary | ICD-10-CM | POA: Diagnosis not present

## 2024-07-30 DIAGNOSIS — D225 Melanocytic nevi of trunk: Secondary | ICD-10-CM | POA: Diagnosis not present

## 2024-07-30 DIAGNOSIS — L821 Other seborrheic keratosis: Secondary | ICD-10-CM | POA: Diagnosis not present

## 2024-07-30 DIAGNOSIS — L814 Other melanin hyperpigmentation: Secondary | ICD-10-CM | POA: Diagnosis not present

## 2024-07-30 DIAGNOSIS — Z85820 Personal history of malignant melanoma of skin: Secondary | ICD-10-CM | POA: Diagnosis not present

## 2024-08-03 DIAGNOSIS — G9050 Complex regional pain syndrome I, unspecified: Secondary | ICD-10-CM | POA: Diagnosis not present

## 2024-08-03 DIAGNOSIS — M47816 Spondylosis without myelopathy or radiculopathy, lumbar region: Secondary | ICD-10-CM | POA: Diagnosis not present

## 2024-08-12 DIAGNOSIS — R5383 Other fatigue: Secondary | ICD-10-CM | POA: Diagnosis not present

## 2024-08-12 DIAGNOSIS — J4520 Mild intermittent asthma, uncomplicated: Secondary | ICD-10-CM | POA: Diagnosis not present

## 2024-08-12 DIAGNOSIS — G4733 Obstructive sleep apnea (adult) (pediatric): Secondary | ICD-10-CM | POA: Diagnosis not present

## 2024-08-17 DIAGNOSIS — N289 Disorder of kidney and ureter, unspecified: Secondary | ICD-10-CM | POA: Diagnosis not present

## 2024-08-17 DIAGNOSIS — N200 Calculus of kidney: Secondary | ICD-10-CM | POA: Diagnosis not present

## 2024-08-17 DIAGNOSIS — N2889 Other specified disorders of kidney and ureter: Secondary | ICD-10-CM | POA: Diagnosis not present

## 2024-08-17 DIAGNOSIS — N281 Cyst of kidney, acquired: Secondary | ICD-10-CM | POA: Diagnosis not present

## 2024-08-19 DIAGNOSIS — H34832 Tributary (branch) retinal vein occlusion, left eye, with macular edema: Secondary | ICD-10-CM | POA: Diagnosis not present

## 2024-08-27 DIAGNOSIS — N644 Mastodynia: Secondary | ICD-10-CM | POA: Diagnosis not present

## 2024-09-02 DIAGNOSIS — M47816 Spondylosis without myelopathy or radiculopathy, lumbar region: Secondary | ICD-10-CM | POA: Diagnosis not present

## 2024-09-02 DIAGNOSIS — G9050 Complex regional pain syndrome I, unspecified: Secondary | ICD-10-CM | POA: Diagnosis not present

## 2024-09-23 DIAGNOSIS — H34832 Tributary (branch) retinal vein occlusion, left eye, with macular edema: Secondary | ICD-10-CM | POA: Diagnosis not present

## 2024-09-29 DIAGNOSIS — N200 Calculus of kidney: Secondary | ICD-10-CM | POA: Diagnosis not present

## 2024-09-29 DIAGNOSIS — N289 Disorder of kidney and ureter, unspecified: Secondary | ICD-10-CM | POA: Diagnosis not present

## 2024-10-20 DIAGNOSIS — E1169 Type 2 diabetes mellitus with other specified complication: Secondary | ICD-10-CM | POA: Diagnosis not present

## 2024-10-20 DIAGNOSIS — Z6841 Body Mass Index (BMI) 40.0 and over, adult: Secondary | ICD-10-CM | POA: Diagnosis not present

## 2024-10-20 DIAGNOSIS — H353290 Exudative age-related macular degeneration, unspecified eye, stage unspecified: Secondary | ICD-10-CM | POA: Diagnosis not present

## 2024-10-20 DIAGNOSIS — J0120 Acute ethmoidal sinusitis, unspecified: Secondary | ICD-10-CM | POA: Diagnosis not present

## 2024-10-29 DIAGNOSIS — D225 Melanocytic nevi of trunk: Secondary | ICD-10-CM | POA: Diagnosis not present

## 2024-10-29 DIAGNOSIS — Z85820 Personal history of malignant melanoma of skin: Secondary | ICD-10-CM | POA: Diagnosis not present

## 2024-10-29 DIAGNOSIS — L578 Other skin changes due to chronic exposure to nonionizing radiation: Secondary | ICD-10-CM | POA: Diagnosis not present

## 2024-10-29 DIAGNOSIS — D485 Neoplasm of uncertain behavior of skin: Secondary | ICD-10-CM | POA: Diagnosis not present

## 2024-10-29 DIAGNOSIS — L814 Other melanin hyperpigmentation: Secondary | ICD-10-CM | POA: Diagnosis not present

## 2024-11-02 DIAGNOSIS — H34832 Tributary (branch) retinal vein occlusion, left eye, with macular edema: Secondary | ICD-10-CM | POA: Diagnosis not present

## 2024-12-21 NOTE — Progress Notes (Signed)
 Sandy Garcia                                          MRN: [id number]   12/21/2024   The [facility] Quality Team Specialist reviewed this patient medical record for the purposes of chart review for care gap closure. The following were reviewed: abstraction for care gap closure-controlling blood pressure.    [facility] Quality Team
